# Patient Record
Sex: Female | Born: 1950 | Race: White | Hispanic: No | Smoking: Former smoker
Health system: Southern US, Community
[De-identification: ages and names within clinical notes are randomized; demographics above are authoritative.]

## PROBLEM LIST (undated history)

## (undated) DIAGNOSIS — I739 Peripheral vascular disease, unspecified: Secondary | ICD-10-CM

## (undated) DIAGNOSIS — L570 Actinic keratosis: Secondary | ICD-10-CM

## (undated) HISTORY — DX: Actinic keratosis: L57.0

## (undated) HISTORY — DX: Peripheral vascular disease, unspecified: I73.9

---

## 1998-05-13 ENCOUNTER — Other Ambulatory Visit: Admission: RE | Admit: 1998-05-13 | Discharge: 1998-05-13 | Payer: Self-pay | Admitting: Obstetrics and Gynecology

## 1999-05-31 ENCOUNTER — Other Ambulatory Visit: Admission: RE | Admit: 1999-05-31 | Discharge: 1999-05-31 | Payer: Self-pay | Admitting: Obstetrics and Gynecology

## 1999-05-31 ENCOUNTER — Encounter (INDEPENDENT_AMBULATORY_CARE_PROVIDER_SITE_OTHER): Payer: Self-pay

## 2000-03-04 ENCOUNTER — Encounter: Admission: RE | Admit: 2000-03-04 | Discharge: 2000-03-04 | Payer: Self-pay | Admitting: Obstetrics and Gynecology

## 2000-03-04 ENCOUNTER — Encounter: Payer: Self-pay | Admitting: Obstetrics and Gynecology

## 2000-03-05 ENCOUNTER — Other Ambulatory Visit: Admission: RE | Admit: 2000-03-05 | Discharge: 2000-03-05 | Payer: Self-pay | Admitting: Obstetrics and Gynecology

## 2000-03-05 ENCOUNTER — Encounter (INDEPENDENT_AMBULATORY_CARE_PROVIDER_SITE_OTHER): Payer: Self-pay | Admitting: Specialist

## 2001-04-30 ENCOUNTER — Encounter: Admission: RE | Admit: 2001-04-30 | Discharge: 2001-04-30 | Payer: Self-pay | Admitting: Obstetrics and Gynecology

## 2001-04-30 ENCOUNTER — Encounter: Payer: Self-pay | Admitting: Obstetrics and Gynecology

## 2001-05-02 ENCOUNTER — Encounter: Payer: Self-pay | Admitting: Obstetrics and Gynecology

## 2001-05-02 ENCOUNTER — Encounter: Admission: RE | Admit: 2001-05-02 | Discharge: 2001-05-02 | Payer: Self-pay | Admitting: Obstetrics and Gynecology

## 2002-05-06 ENCOUNTER — Encounter: Payer: Self-pay | Admitting: Obstetrics and Gynecology

## 2002-05-06 ENCOUNTER — Encounter: Admission: RE | Admit: 2002-05-06 | Discharge: 2002-05-06 | Payer: Self-pay | Admitting: Obstetrics and Gynecology

## 2003-08-12 ENCOUNTER — Encounter: Admission: RE | Admit: 2003-08-12 | Discharge: 2003-08-12 | Payer: Self-pay | Admitting: Obstetrics and Gynecology

## 2004-05-29 ENCOUNTER — Ambulatory Visit (HOSPITAL_COMMUNITY): Admission: RE | Admit: 2004-05-29 | Discharge: 2004-05-29 | Payer: Self-pay | Admitting: Obstetrics and Gynecology

## 2004-05-29 IMAGING — US US PELVIS COMPLETE MODIFY
1 series · 14 of 25 positions shown · non-contrast
Comparison: none

HISTORY: Left ovarian mass

[Series 1: unknown · 0.32mm/px · 14 of 54 slices shown]
[im 1/54]
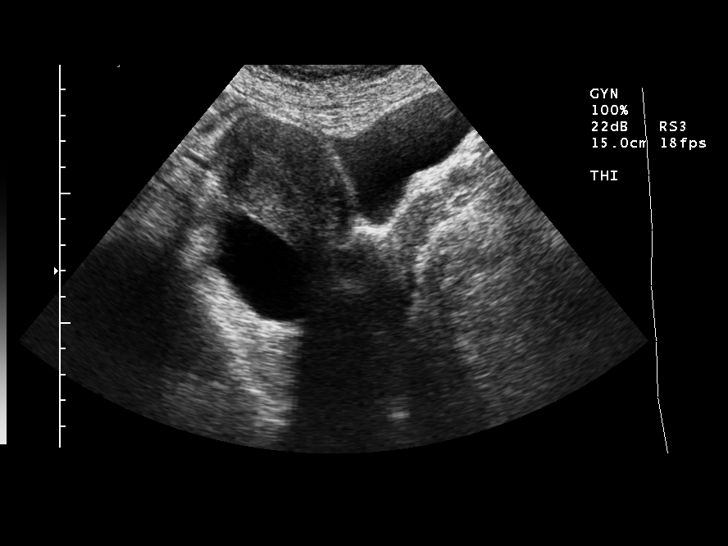
[im 5/54]
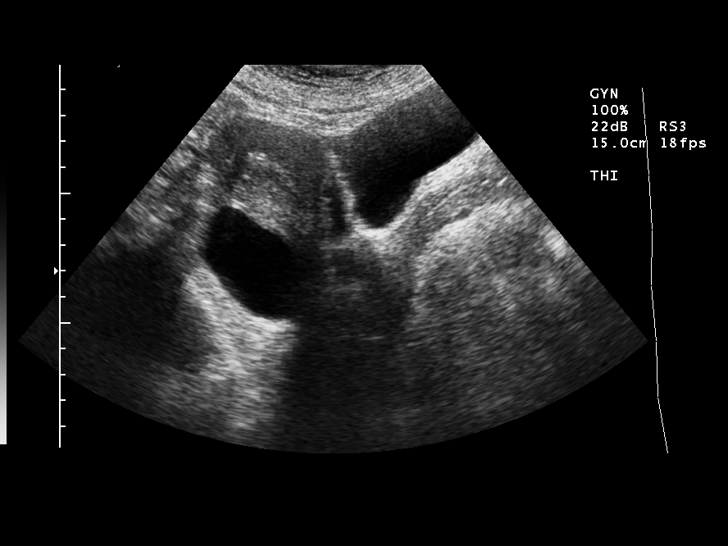
[im 9/54]
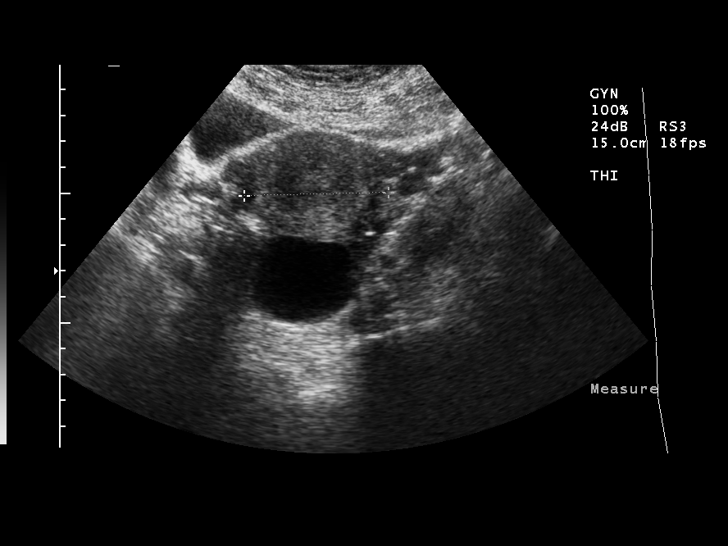
[im 14/54]
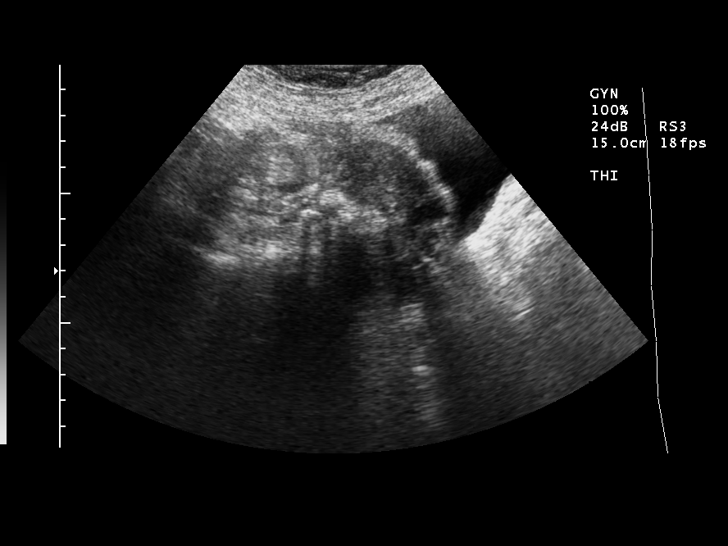
[im 18/54]
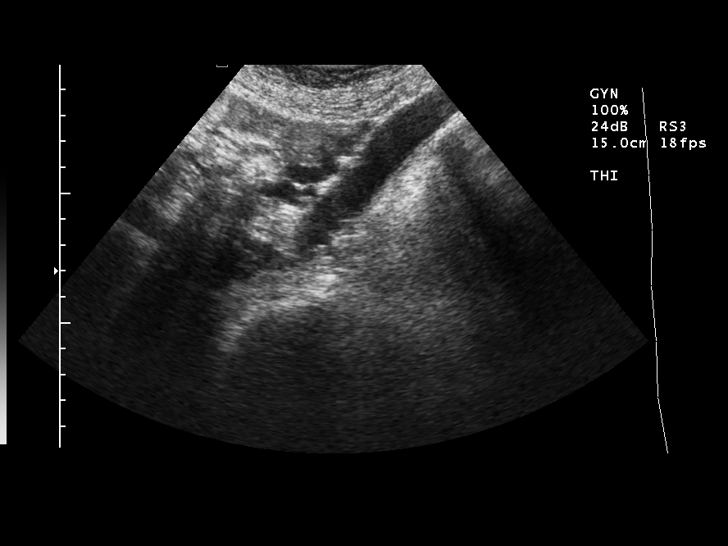
[im 20/54]
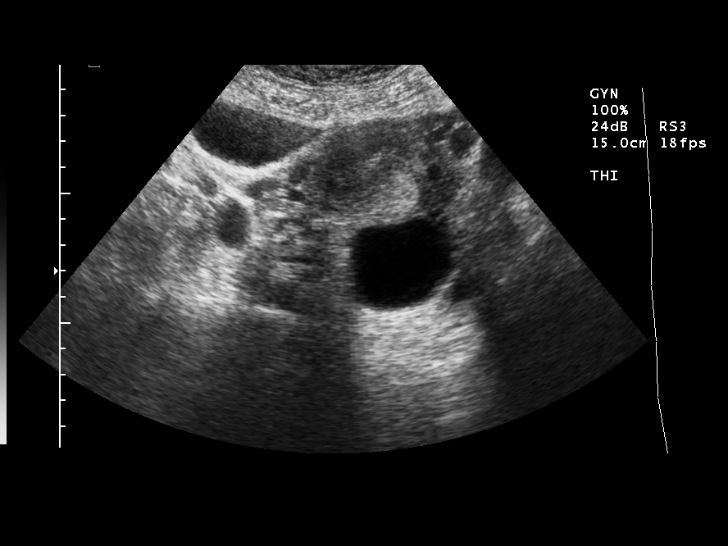
[im 25/54]
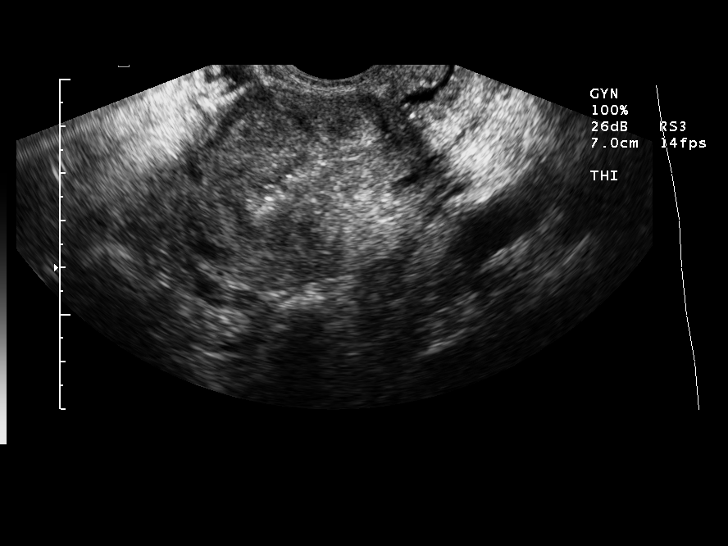
[im 29/54]
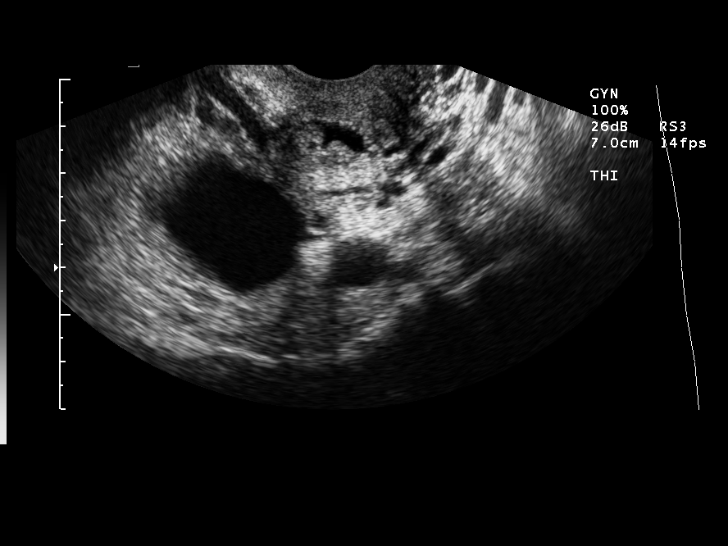
[im 34/54]
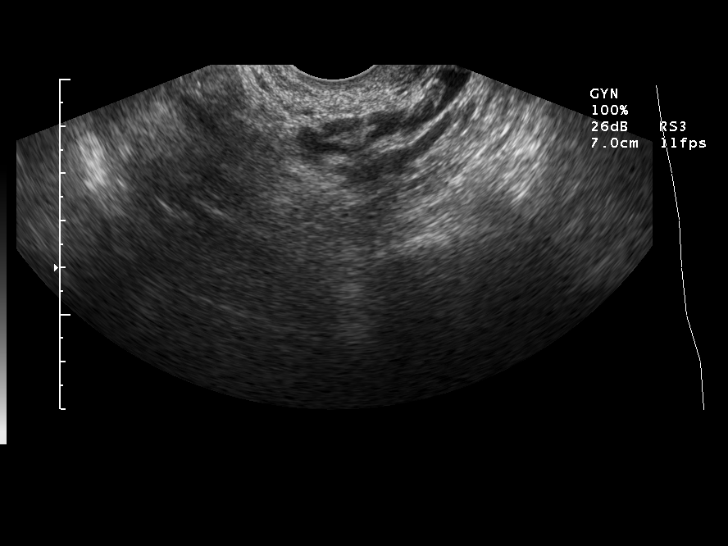
[im 36/54]
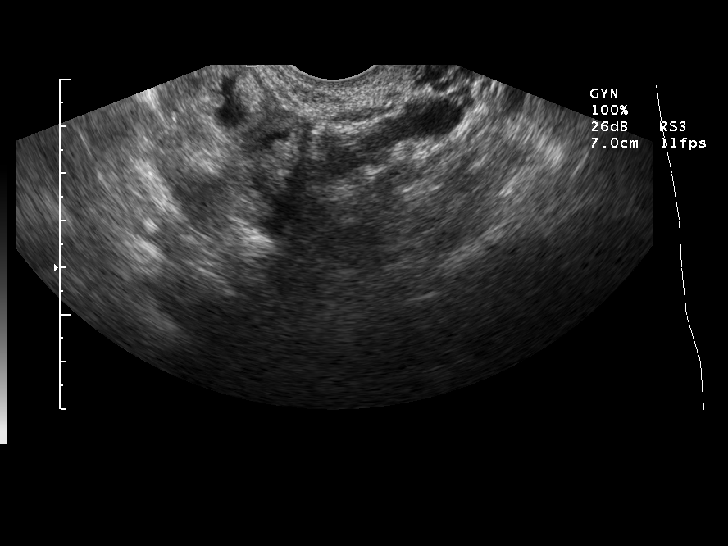
[im 40/54]
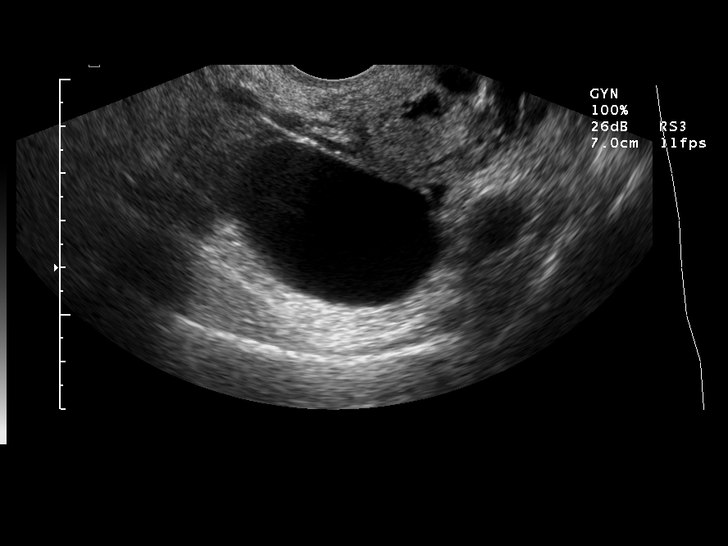
[im 45/54]
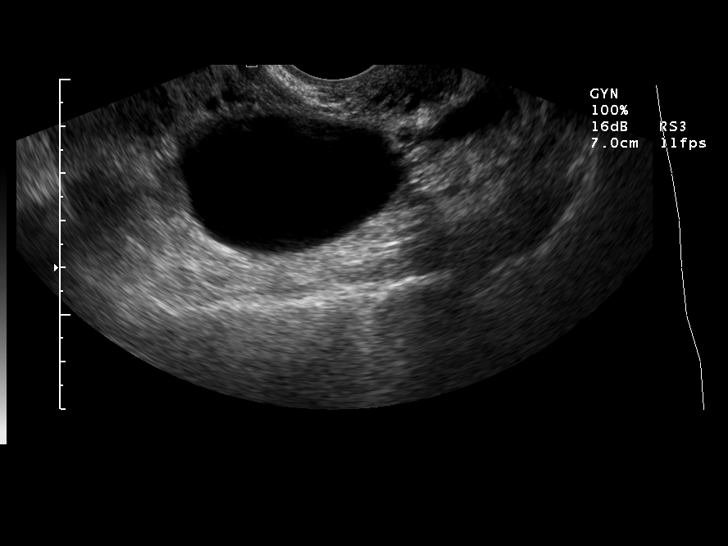
[im 49/54]
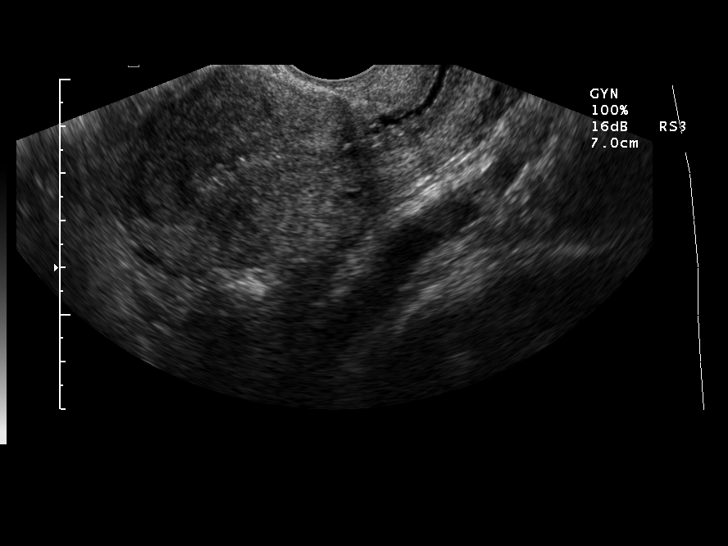
[im 54/54]
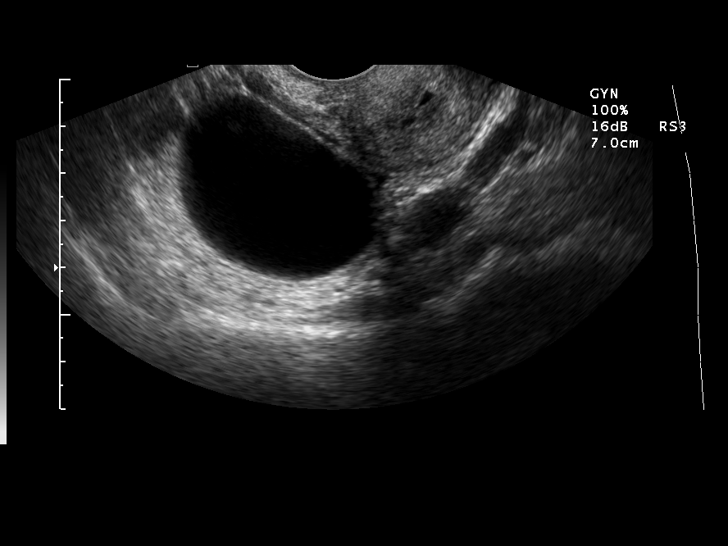

[14 of 25 positions shown; findings below may reference images not displayed]

ULTRASOUND PELVIS COMPLETE MODIFY
ULTRASOUND TRANSVAGINAL PELVIS NON-OBSTETRICAL:

Transabdominal and endovaginal sonography of pelvis performed.

Uterus 10.1 cm length x 4.5 cm AP x 5.6 cm transverse.
Endometrial stripe normal thickness, 9 mm diameter.
Small amount of free pelvic fluid.
No normal ovarian tissue seen bilaterally.
Right adnexal cyst identified, 4.9 x 4.2 x 3.2 cm, question ovarian versus paraovarian.
No solid adnexal mass.
IMPRESSION: Normal appearance of uterus. Nonvisualization of the ovaries. 4.9 cm diameter right adnexal cyst,
question ovarian versus paraovarian.

## 2004-09-22 ENCOUNTER — Encounter: Admission: RE | Admit: 2004-09-22 | Discharge: 2004-09-22 | Payer: Self-pay | Admitting: Obstetrics and Gynecology

## 2005-10-11 ENCOUNTER — Ambulatory Visit (HOSPITAL_COMMUNITY): Admission: RE | Admit: 2005-10-11 | Discharge: 2005-10-11 | Payer: Self-pay | Admitting: Family Medicine

## 2005-10-17 ENCOUNTER — Ambulatory Visit (HOSPITAL_COMMUNITY): Admission: RE | Admit: 2005-10-17 | Discharge: 2005-10-17 | Payer: Self-pay | Admitting: Family Medicine

## 2005-10-18 ENCOUNTER — Encounter: Admission: RE | Admit: 2005-10-18 | Discharge: 2005-10-18 | Payer: Self-pay | Admitting: Obstetrics and Gynecology

## 2005-10-18 IMAGING — MG MM MAMMO SCREENING
3 series · 3 of 3 positions shown · non-contrast
Comparison: none

SCREENING MAMMOGRAM:
There is a fibroglandular pattern.  No masses or malignant type calcifications are identified.  
Compared with prior studies.

[R CC]
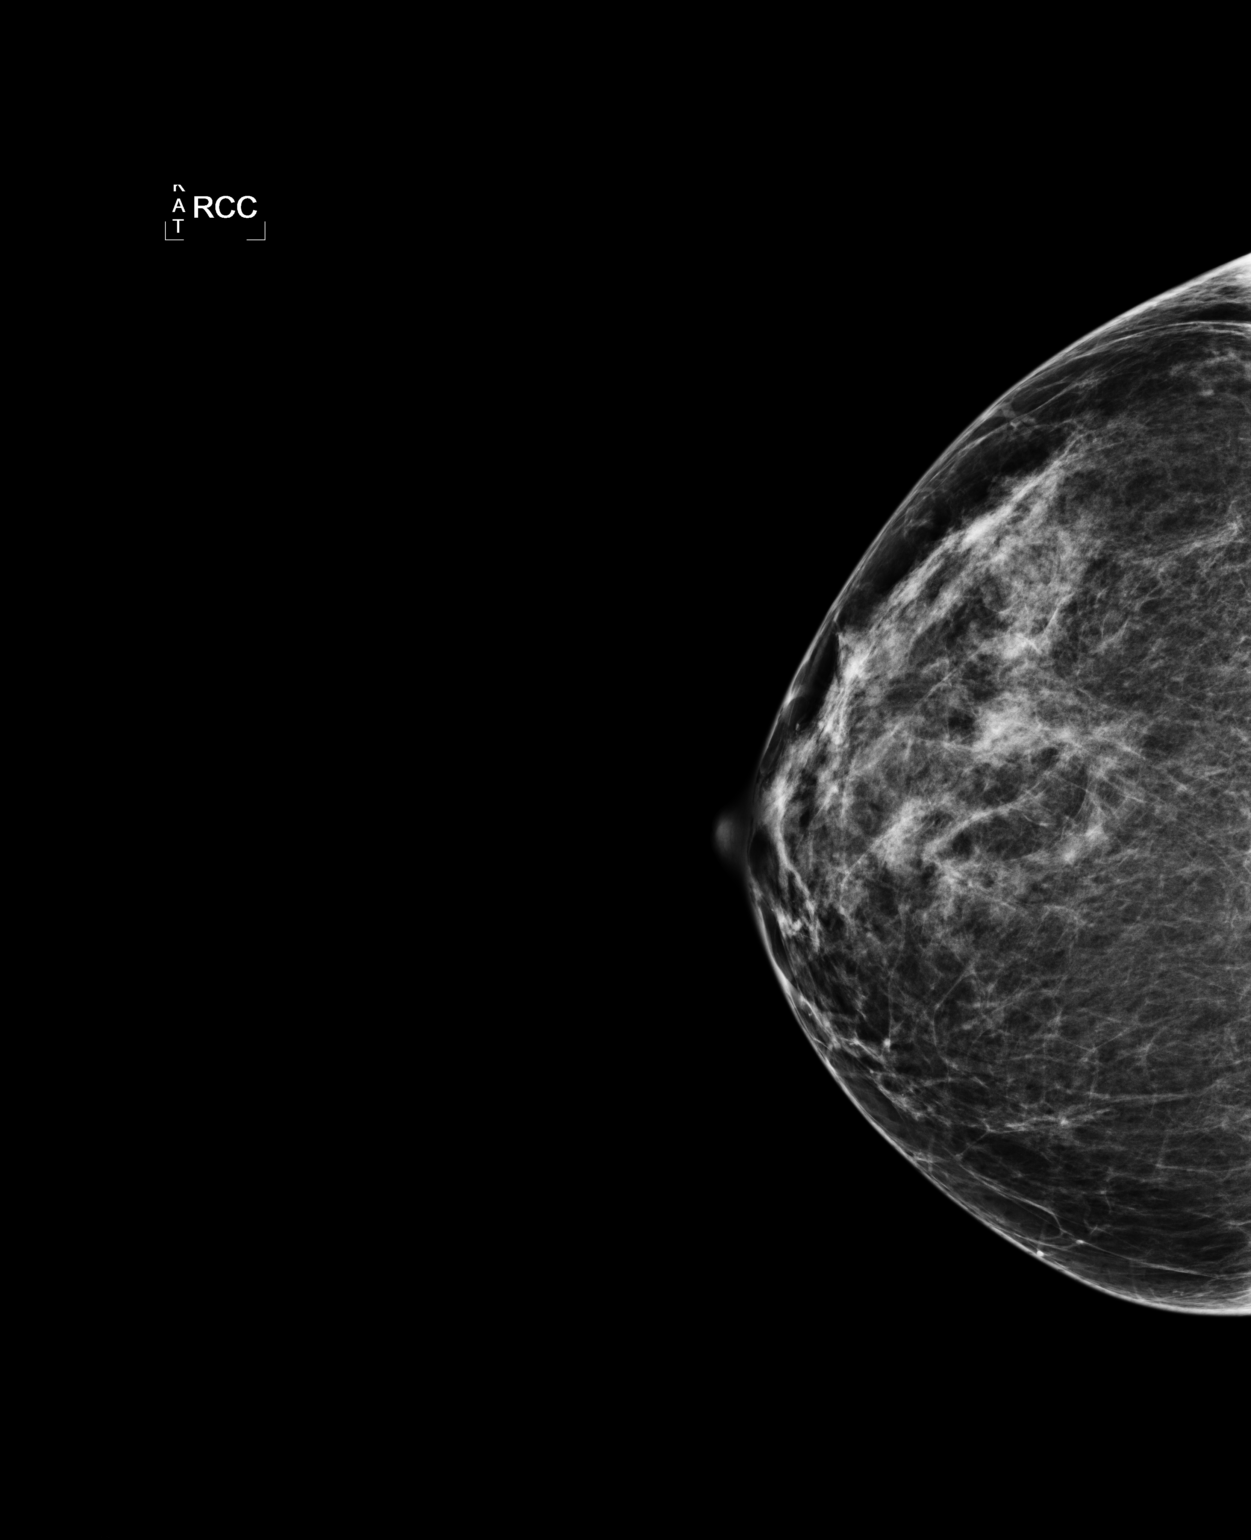

[L CC]
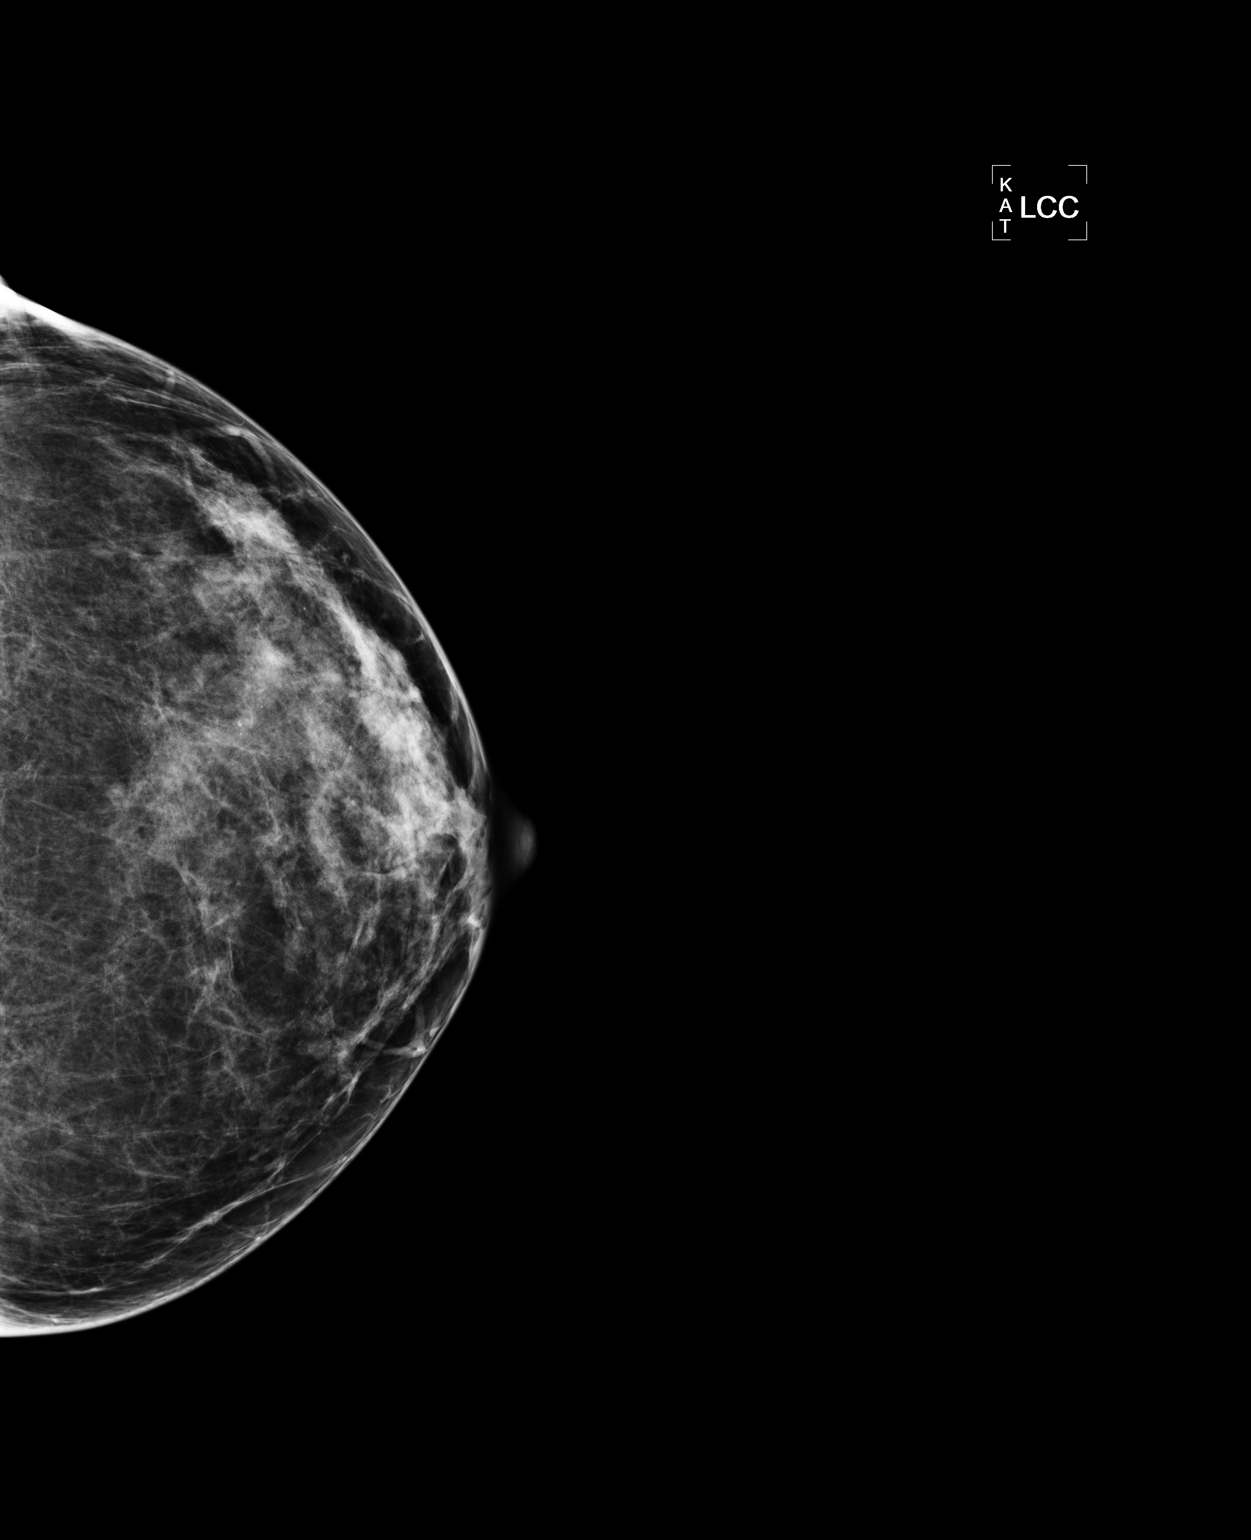

[L MLO]
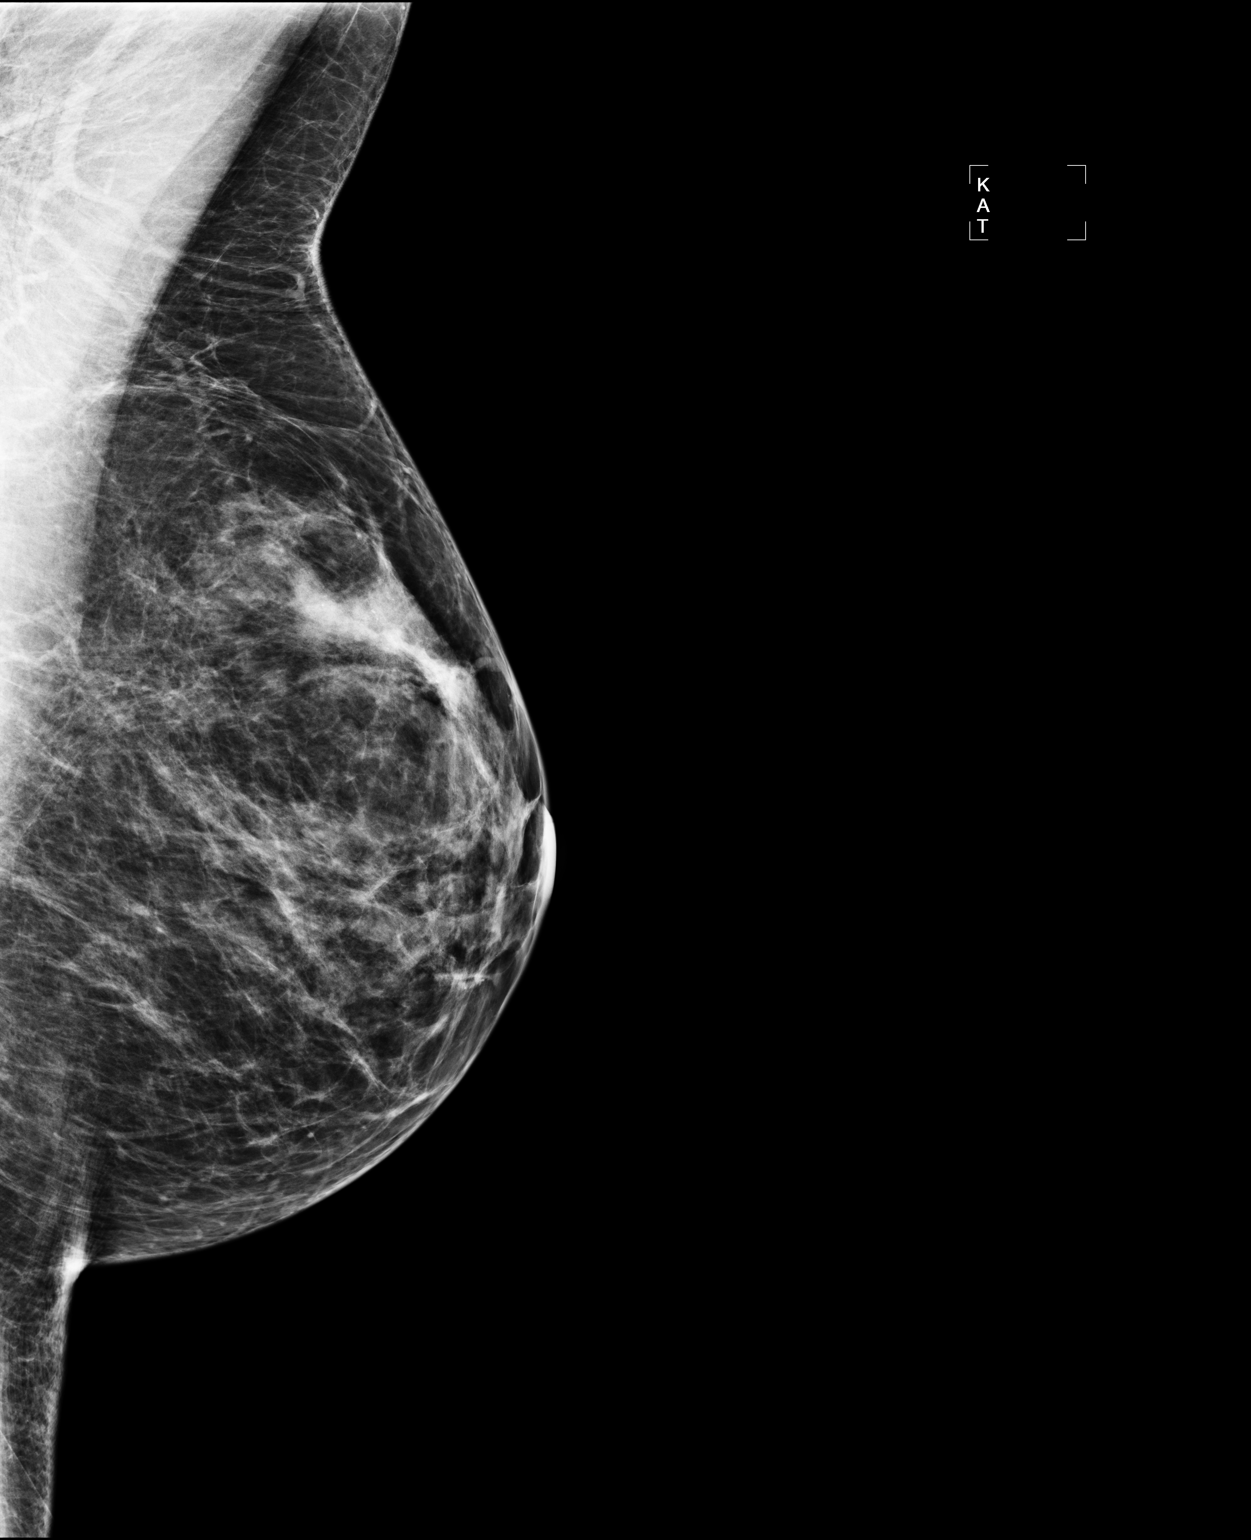

[3 of 3 positions shown; findings below may reference images not displayed]

IMPRESSION: No specific mammographic evidence of malignancy.  Next screening mammogram is recommended in one 
year.

ASSESSMENT: Negative - BI-RADS 1

Screening mammogram in 1 year.

## 2006-06-05 ENCOUNTER — Encounter: Admission: RE | Admit: 2006-06-05 | Discharge: 2006-06-05 | Payer: Self-pay | Admitting: Endocrinology

## 2006-06-21 ENCOUNTER — Encounter: Admission: RE | Admit: 2006-06-21 | Discharge: 2006-06-21 | Payer: Self-pay | Admitting: Endocrinology

## 2006-11-15 ENCOUNTER — Encounter: Admission: RE | Admit: 2006-11-15 | Discharge: 2006-11-15 | Payer: Self-pay | Admitting: Obstetrics and Gynecology

## 2007-12-01 ENCOUNTER — Emergency Department (HOSPITAL_COMMUNITY): Admission: EM | Admit: 2007-12-01 | Discharge: 2007-12-01 | Payer: Self-pay | Admitting: Emergency Medicine

## 2008-03-03 ENCOUNTER — Encounter: Admission: RE | Admit: 2008-03-03 | Discharge: 2008-03-03 | Payer: Self-pay | Admitting: Obstetrics and Gynecology

## 2008-08-06 HISTORY — PX: BREAST EXCISIONAL BIOPSY: SUR124

## 2009-03-11 ENCOUNTER — Encounter: Admission: RE | Admit: 2009-03-11 | Discharge: 2009-03-11 | Payer: Self-pay | Admitting: Obstetrics and Gynecology

## 2009-03-16 ENCOUNTER — Encounter: Admission: RE | Admit: 2009-03-16 | Discharge: 2009-03-16 | Payer: Self-pay | Admitting: Obstetrics and Gynecology

## 2009-05-04 ENCOUNTER — Encounter: Admission: RE | Admit: 2009-05-04 | Discharge: 2009-05-04 | Payer: Self-pay | Admitting: General Surgery

## 2009-05-04 IMAGING — CR DG CHEST 2V
2 series · 2 of 2 positions shown · non-contrast
Comparison: Chest x-ray of [DATE]

CLINICAL DATA: Preop for surgery for left breast lesion, smoking
history

CHEST - 2 VIEW

[w chest pa]
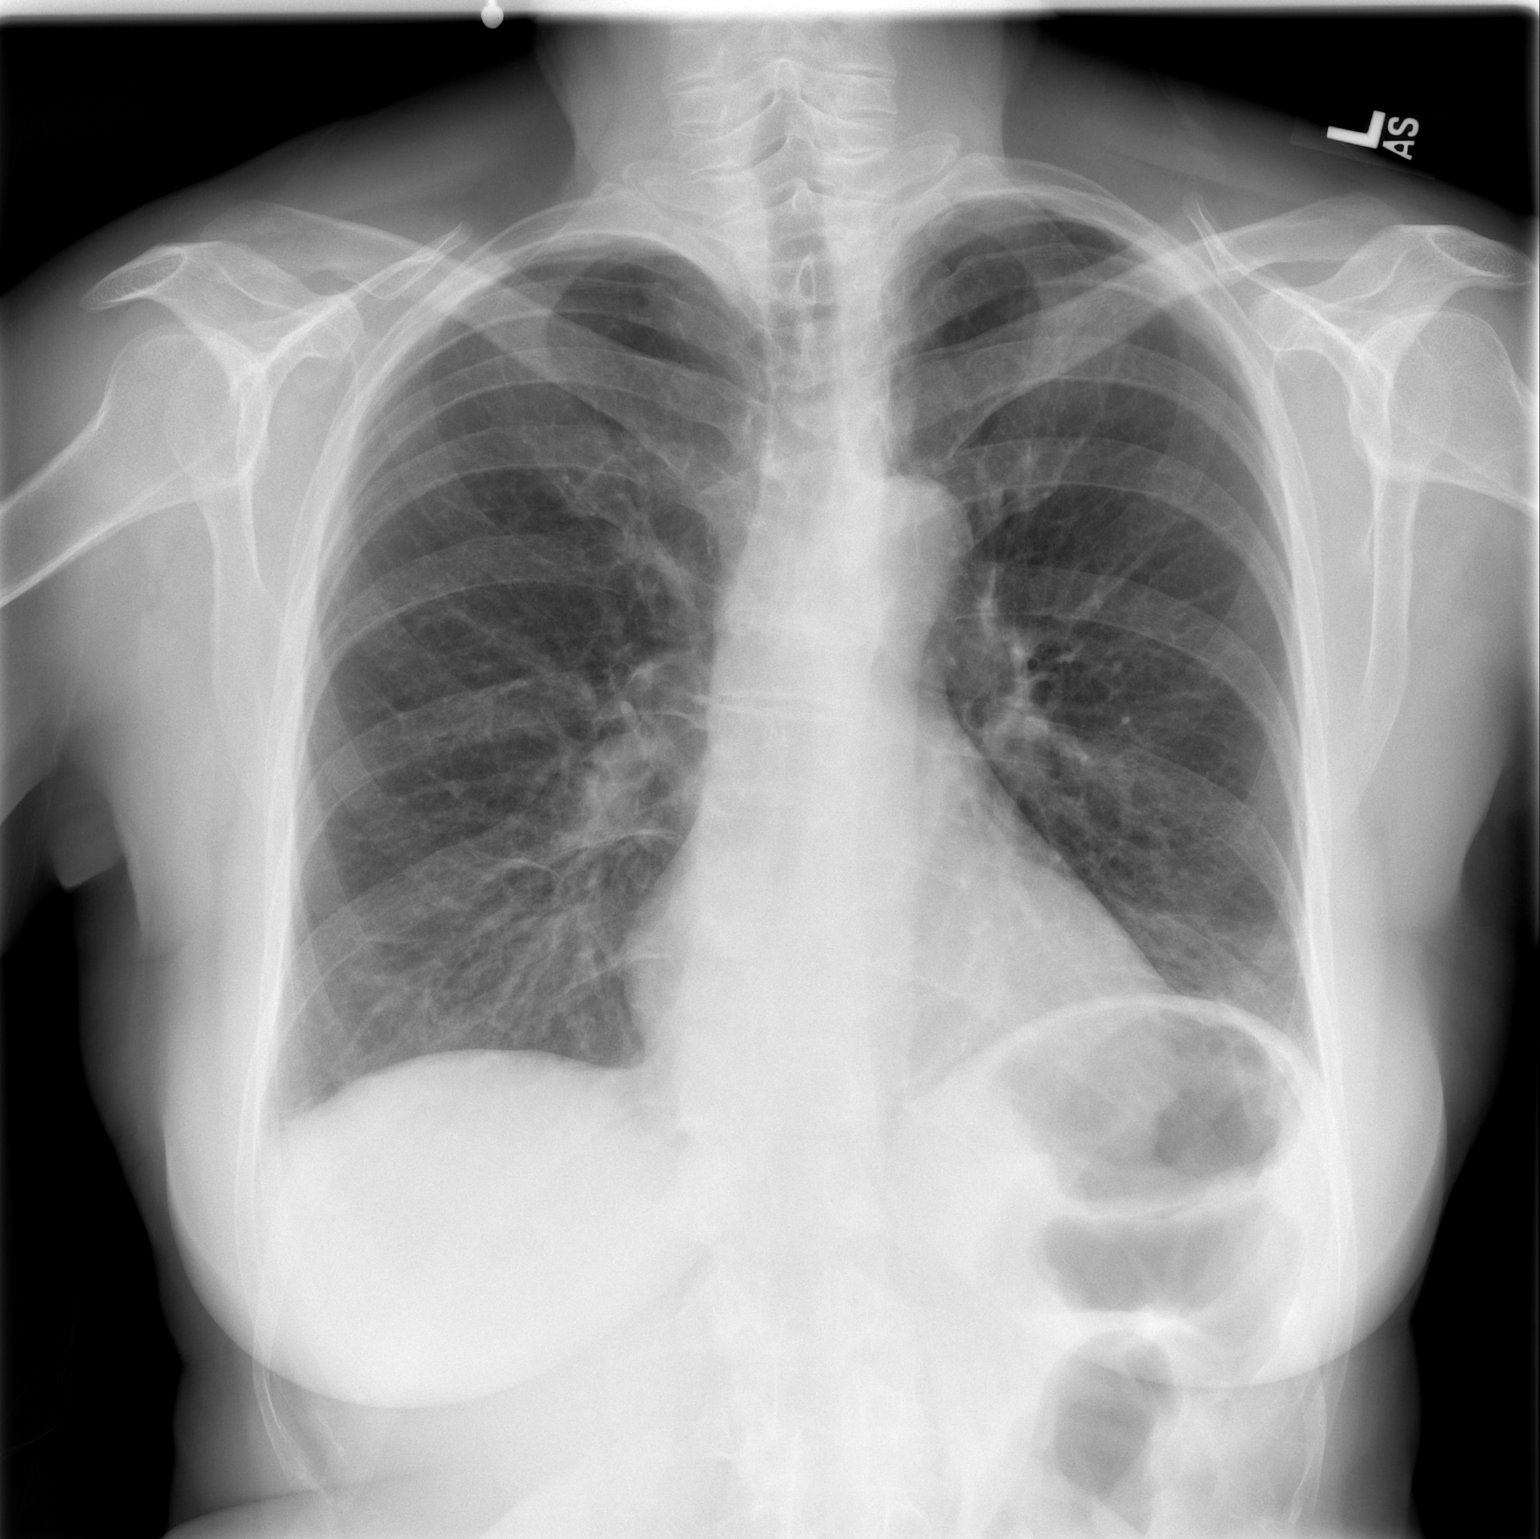

[w chest lat]
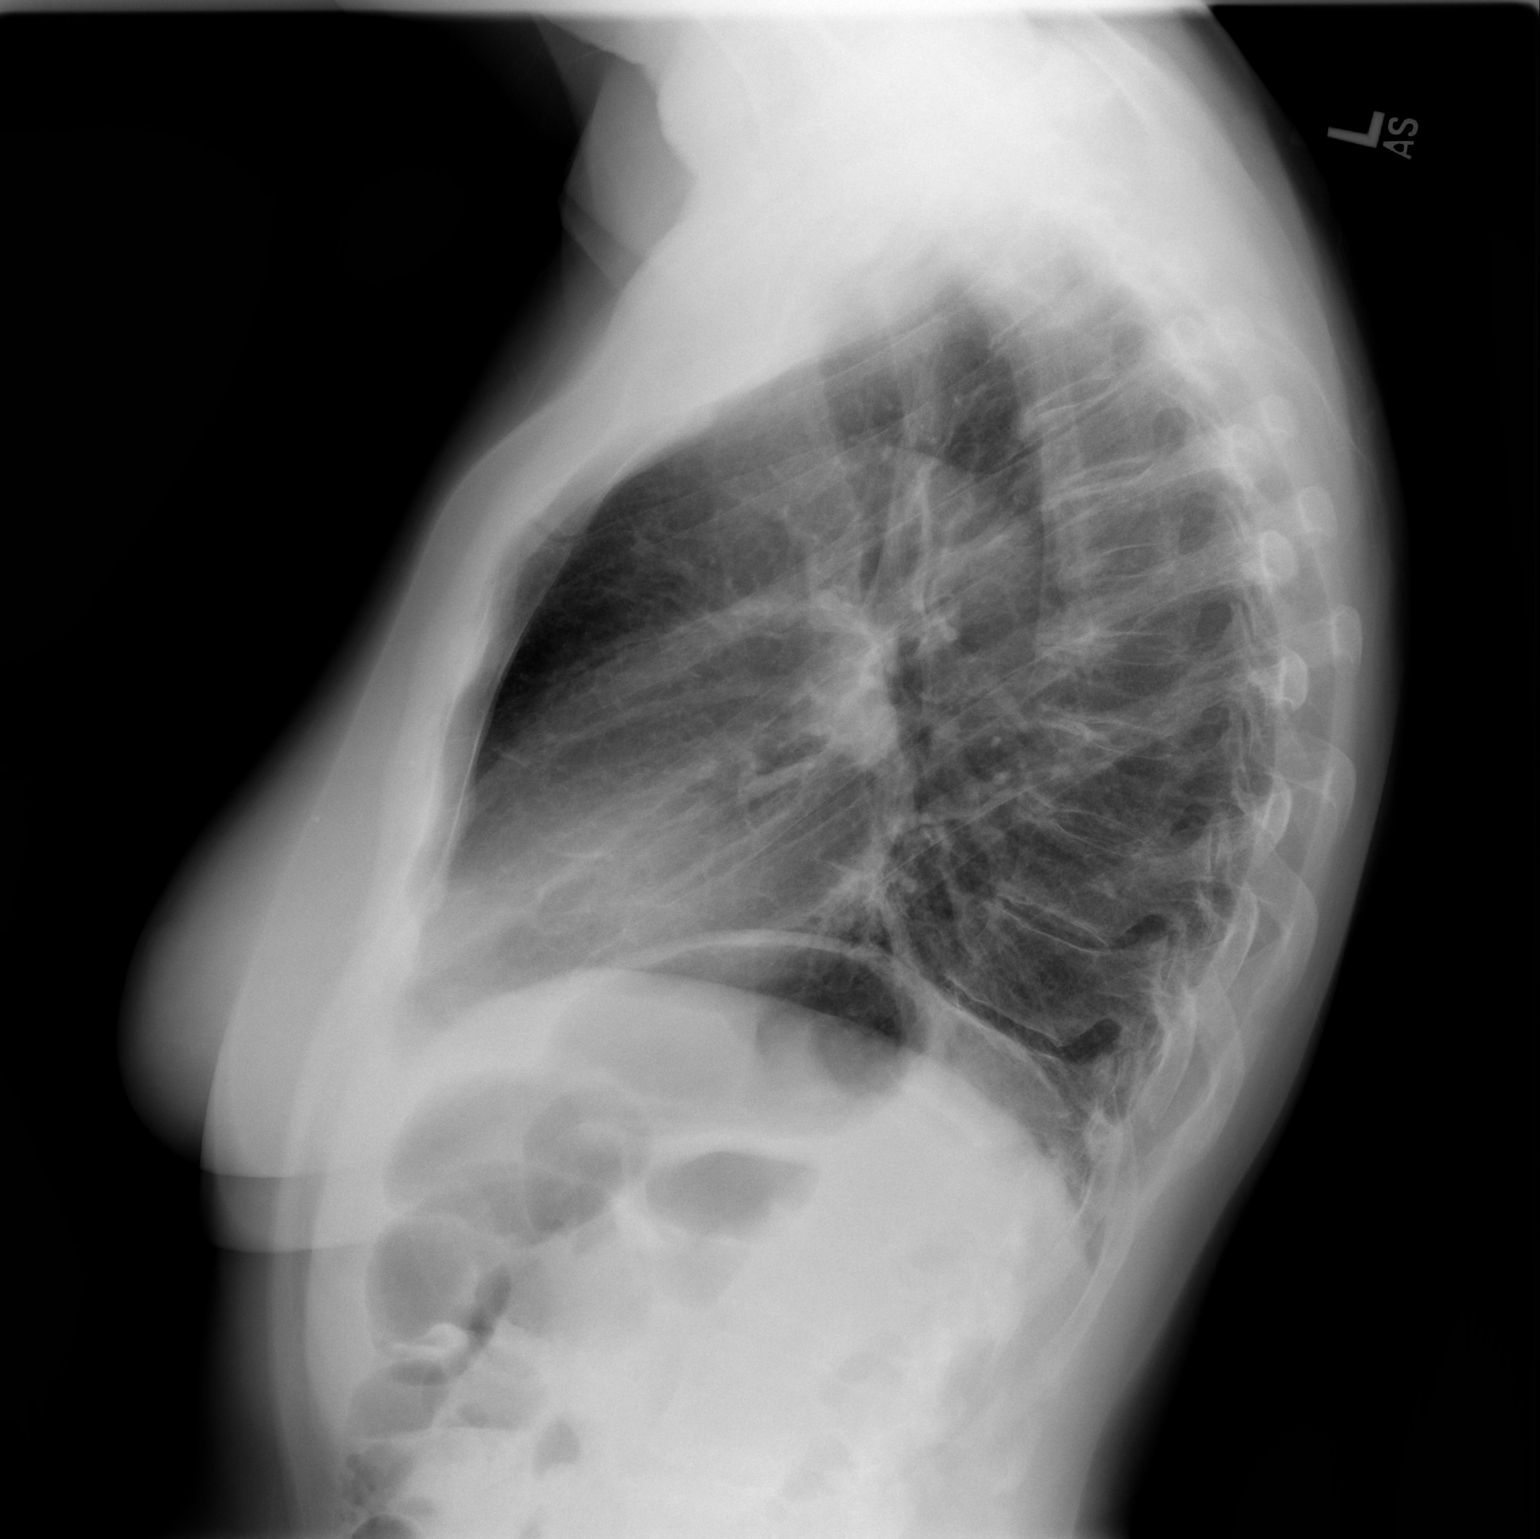

[2 of 2 positions shown; findings below may reference images not displayed]

FINDINGS: No active infiltrate or effusion is seen.  Minimal
atelectasis or scarring is noted at the left lung base.  There is
some peribronchial thickening which may indicate bronchitis.  The
heart is within normal limits in size.  No bony abnormality is
seen.
IMPRESSION: No active lung disease.  Mild left basilar linear atelectasis or
scarring.

## 2009-05-09 ENCOUNTER — Ambulatory Visit (HOSPITAL_BASED_OUTPATIENT_CLINIC_OR_DEPARTMENT_OTHER): Admission: RE | Admit: 2009-05-09 | Discharge: 2009-05-09 | Payer: Self-pay | Admitting: General Surgery

## 2009-05-09 ENCOUNTER — Encounter: Admission: RE | Admit: 2009-05-09 | Discharge: 2009-05-09 | Payer: Self-pay | Admitting: General Surgery

## 2009-05-09 ENCOUNTER — Encounter (INDEPENDENT_AMBULATORY_CARE_PROVIDER_SITE_OTHER): Payer: Self-pay | Admitting: General Surgery

## 2009-09-16 ENCOUNTER — Ambulatory Visit (HOSPITAL_COMMUNITY): Admission: RE | Admit: 2009-09-16 | Discharge: 2009-09-16 | Payer: Self-pay | Admitting: Obstetrics and Gynecology

## 2009-09-16 IMAGING — US US TRANSVAGINAL NON-OB
1 series · 13 of 25 positions shown · non-contrast
Comparison: [DATE]

CLINICAL DATA: Left ovarian mass, history of ovarian cyst and
cesarean section

TRANSABDOMINAL AND TRANSVAGINAL ULTRASOUND OF PELVIS
TECHNIQUE: Both transabdominal and transvaginal ultrasound
examinations of the pelvis were performed including evaluation of
the uterus, ovaries, adnexal regions, and pelvic cul-de-sac.

[Series 1: us transvaginal non-ob · 0.30mm/px · 13 of 54 slices shown]
[im 1/54]
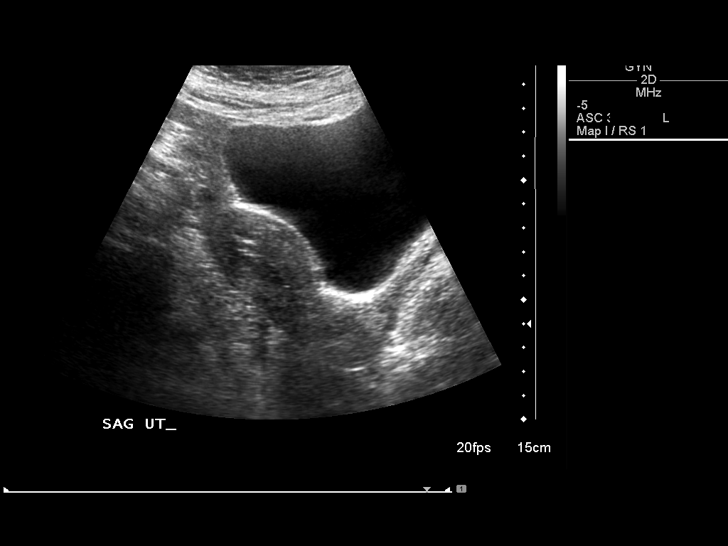
[im 5/54]
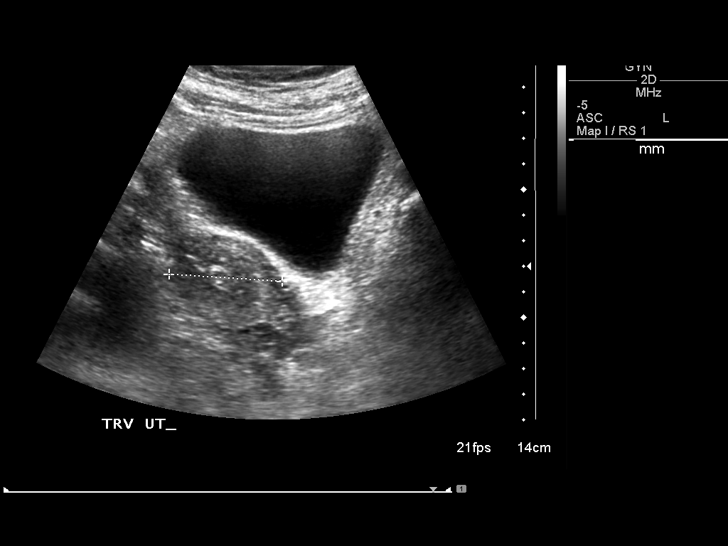
[im 9/54]
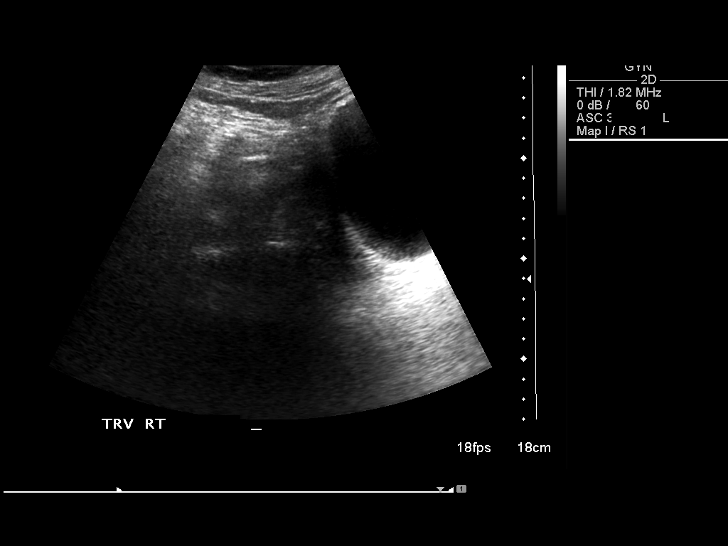
[im 14/54]
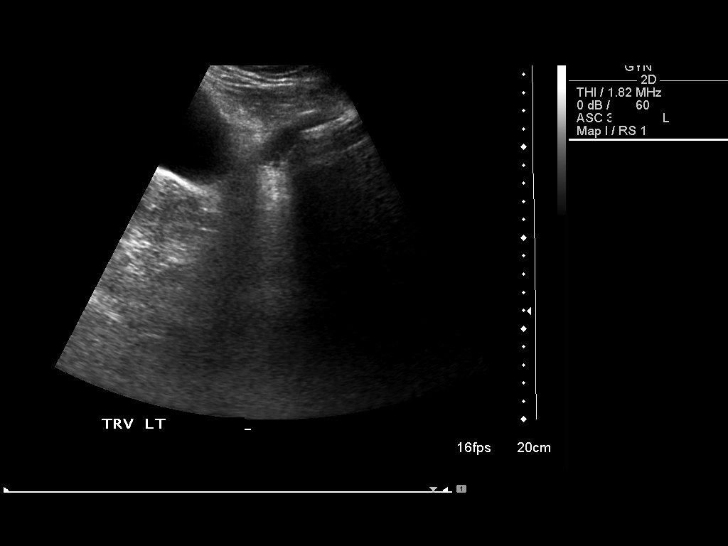
[im 18/54]
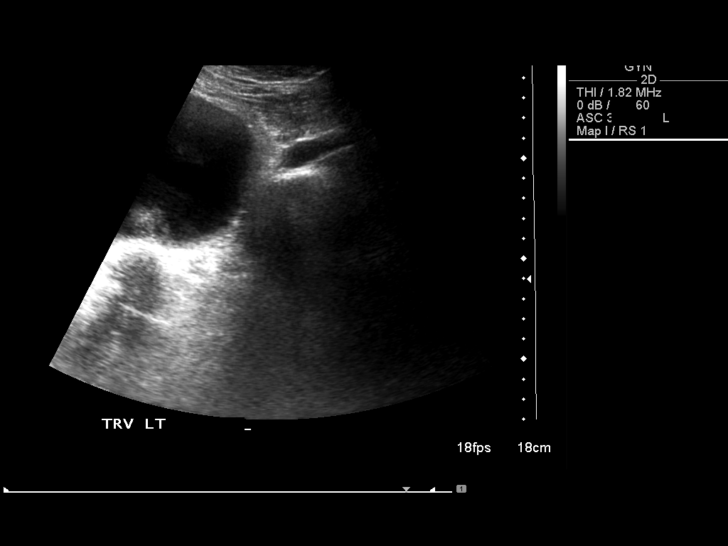
[im 23/54]
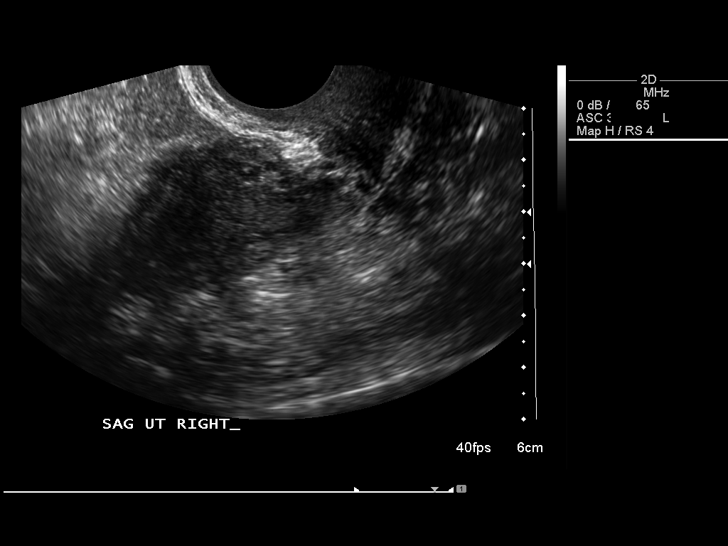
[im 27/54]
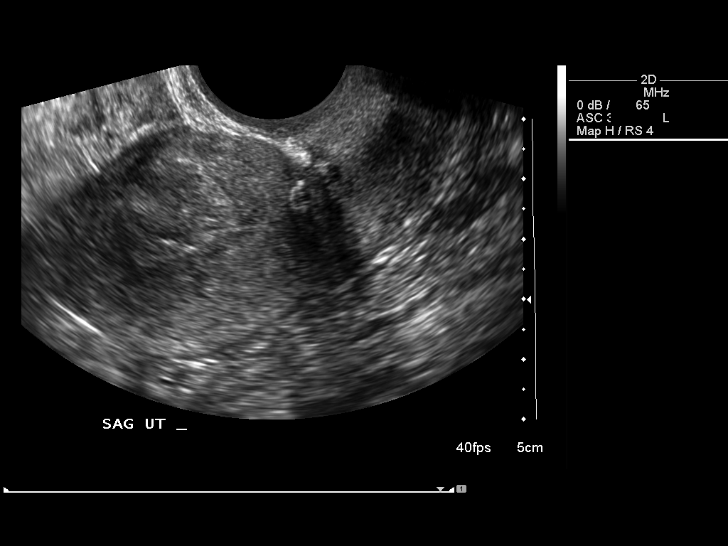
[im 31/54]
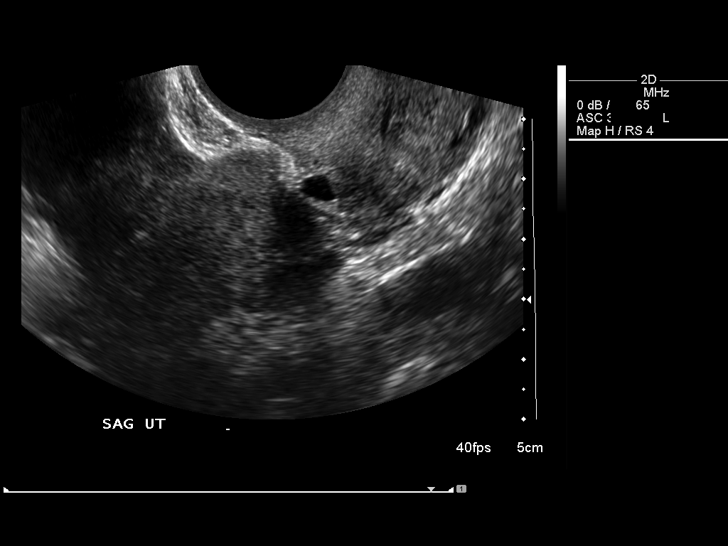
[im 36/54]
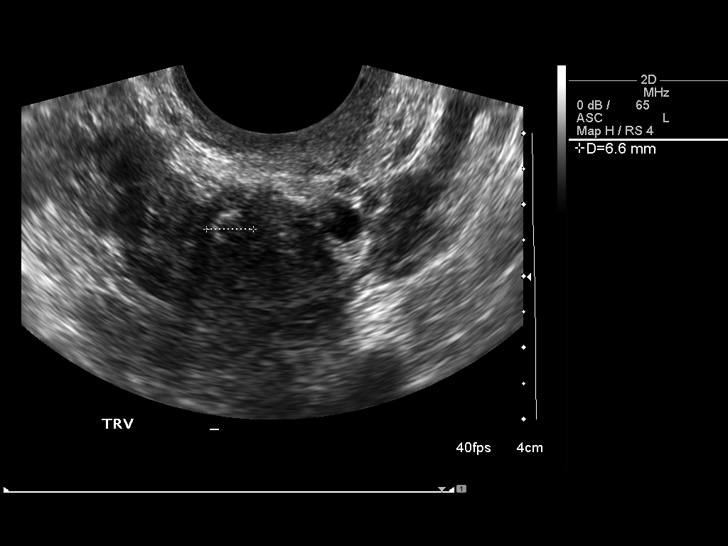
[im 40/54]
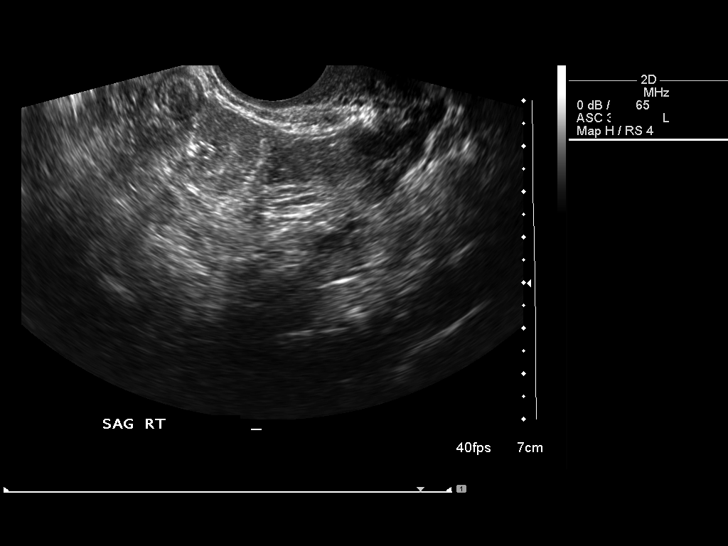
[im 45/54]
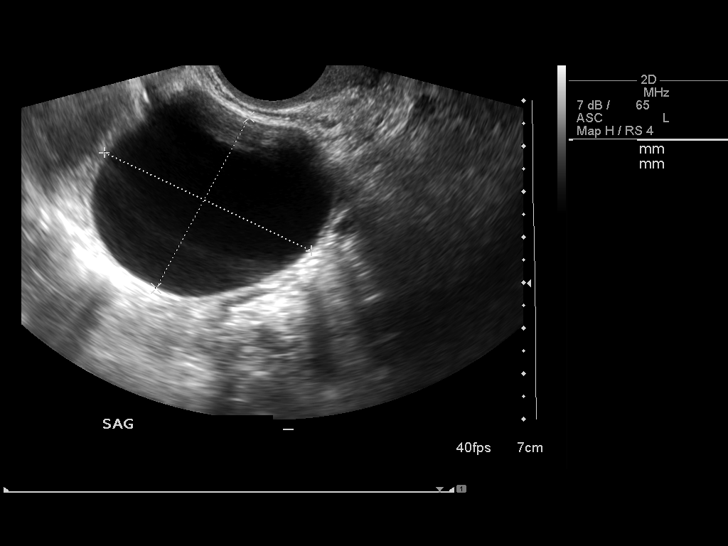
[im 49/54]
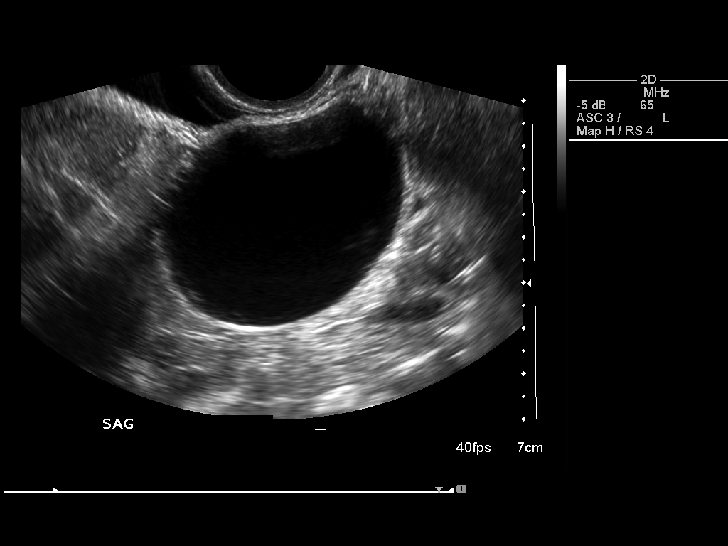
[im 54/54]
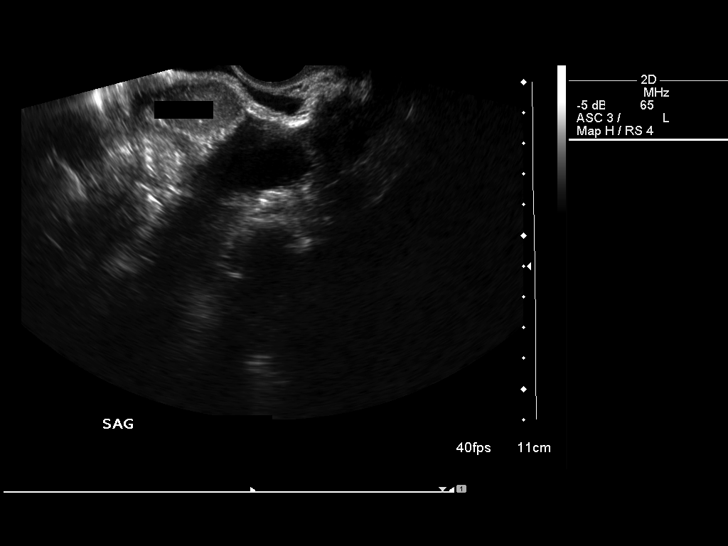

[13 of 25 positions shown; findings below may reference images not displayed]

FINDINGS: Uterus measures 8.8 cm length by 3.4 cm AP by 4.4 cm transverse.
Focus of abnormal echogenicity at anterior aspect of the mid to
lower uterus, suspect related to scarring from cesarean section.
No definite focal uterine mass.

Endometrium measures 5 mm thick, normal.  Minimal fluid in the
cervical canal.

Right Ovary not visualized on either transabdominal or endovaginal
imaging due to presence of significant bowel in the right adnexa.

Left Ovary is not definitely visualized.  No discrete normal
appearing ovarian tissue is seen.  In left adnexa, a cystic
structure is identified 5.0 x 4.3 x 5.5 cm in size, simple in
character, with a thin wall. No definite mural nodules or
septations.

Other Findings:  No free pelvic fluid or additional adnexal masses.
IMPRESSION: Probable scarring anterior aspect of the mid to lower uterus,
likely related to the prior cesarean section.
Nonvisualization of right ovary due to bowel gas.
Nonvisualization of left ovary, with a large simple appearing cyst
identified in left adnexa, 5.5 cm greatest size, question ovarian
versus paraovarian in origin.
Recommend follow-up sonographic surveillance imaging in 1 year.
This recommendation follows the consensus statement:  "Management
of Asymptomatic Ovarian and Other Adnexal Cysts Imaged at US:
Society of Radiologists in Ultrasound Consensus Conference
[URL]

## 2010-04-04 ENCOUNTER — Encounter: Admission: RE | Admit: 2010-04-04 | Discharge: 2010-04-04 | Payer: Self-pay | Admitting: Obstetrics and Gynecology

## 2010-11-10 LAB — URINALYSIS, ROUTINE W REFLEX MICROSCOPIC
Ketones, ur: NEGATIVE mg/dL
Nitrite: NEGATIVE
Protein, ur: NEGATIVE mg/dL
Urobilinogen, UA: 0.2 mg/dL (ref 0.0–1.0)

## 2010-11-10 LAB — DIFFERENTIAL
Basophils Relative: 1 % (ref 0–1)
Monocytes Absolute: 0.5 10*3/uL (ref 0.1–1.0)
Monocytes Relative: 8 % (ref 3–12)
Neutro Abs: 2.8 10*3/uL (ref 1.7–7.7)

## 2010-11-10 LAB — COMPREHENSIVE METABOLIC PANEL
ALT: 10 U/L (ref 0–35)
Albumin: 3.7 g/dL (ref 3.5–5.2)
Alkaline Phosphatase: 60 U/L (ref 39–117)
BUN: 11 mg/dL (ref 6–23)
Potassium: 3.9 mEq/L (ref 3.5–5.1)
Sodium: 139 mEq/L (ref 135–145)
Total Protein: 6.8 g/dL (ref 6.0–8.3)

## 2010-11-10 LAB — CBC
Platelets: 269 10*3/uL (ref 150–400)
RDW: 13.1 % (ref 11.5–15.5)

## 2011-04-30 ENCOUNTER — Other Ambulatory Visit: Payer: Self-pay | Admitting: Nurse Practitioner

## 2011-04-30 DIAGNOSIS — Z1231 Encounter for screening mammogram for malignant neoplasm of breast: Secondary | ICD-10-CM

## 2011-05-08 ENCOUNTER — Other Ambulatory Visit: Payer: Self-pay | Admitting: Family Medicine

## 2011-05-08 DIAGNOSIS — E041 Nontoxic single thyroid nodule: Secondary | ICD-10-CM

## 2011-05-09 ENCOUNTER — Ambulatory Visit
Admission: RE | Admit: 2011-05-09 | Discharge: 2011-05-09 | Disposition: A | Discharge status: Home / Self Care | Payer: BC Managed Care – PPO | Source: Ambulatory Visit | Attending: Nurse Practitioner | Admitting: Nurse Practitioner

## 2011-05-09 DIAGNOSIS — Z1231 Encounter for screening mammogram for malignant neoplasm of breast: Secondary | ICD-10-CM

## 2011-05-14 ENCOUNTER — Other Ambulatory Visit: Payer: Self-pay

## 2012-04-23 ENCOUNTER — Other Ambulatory Visit: Payer: Self-pay | Admitting: Nurse Practitioner

## 2012-04-23 DIAGNOSIS — Z1231 Encounter for screening mammogram for malignant neoplasm of breast: Secondary | ICD-10-CM

## 2012-05-21 ENCOUNTER — Ambulatory Visit
Admission: RE | Admit: 2012-05-21 | Discharge: 2012-05-21 | Disposition: A | Discharge status: Home / Self Care | Payer: BC Managed Care – PPO | Source: Ambulatory Visit | Attending: Nurse Practitioner | Admitting: Nurse Practitioner

## 2012-05-21 DIAGNOSIS — Z1231 Encounter for screening mammogram for malignant neoplasm of breast: Secondary | ICD-10-CM

## 2013-07-08 ENCOUNTER — Other Ambulatory Visit: Payer: Self-pay

## 2013-07-08 ENCOUNTER — Other Ambulatory Visit: Payer: Self-pay | Admitting: Internal Medicine

## 2013-07-08 DIAGNOSIS — Z1231 Encounter for screening mammogram for malignant neoplasm of breast: Secondary | ICD-10-CM

## 2013-08-11 ENCOUNTER — Ambulatory Visit
Admission: RE | Admit: 2013-08-11 | Discharge: 2013-08-11 | Disposition: A | Discharge status: Home / Self Care | Payer: BC Managed Care – PPO | Source: Ambulatory Visit

## 2013-08-11 DIAGNOSIS — Z1231 Encounter for screening mammogram for malignant neoplasm of breast: Secondary | ICD-10-CM

## 2014-06-28 ENCOUNTER — Ambulatory Visit: Payer: Self-pay | Admitting: Otolaryngology

## 2014-06-28 IMAGING — CR DG CHEST 2V
1 series · 2 of 2 positions shown · non-contrast
Comparison: PA and lateral chest of [DATE]

CLINICAL DATA: Chronic cough ; gastroesophageal reflux

EXAM:
CHEST  2 VIEW

[Series 1: w chest pa · 0.14mm/px · 2 of 2 slices shown]
[im 1/2]
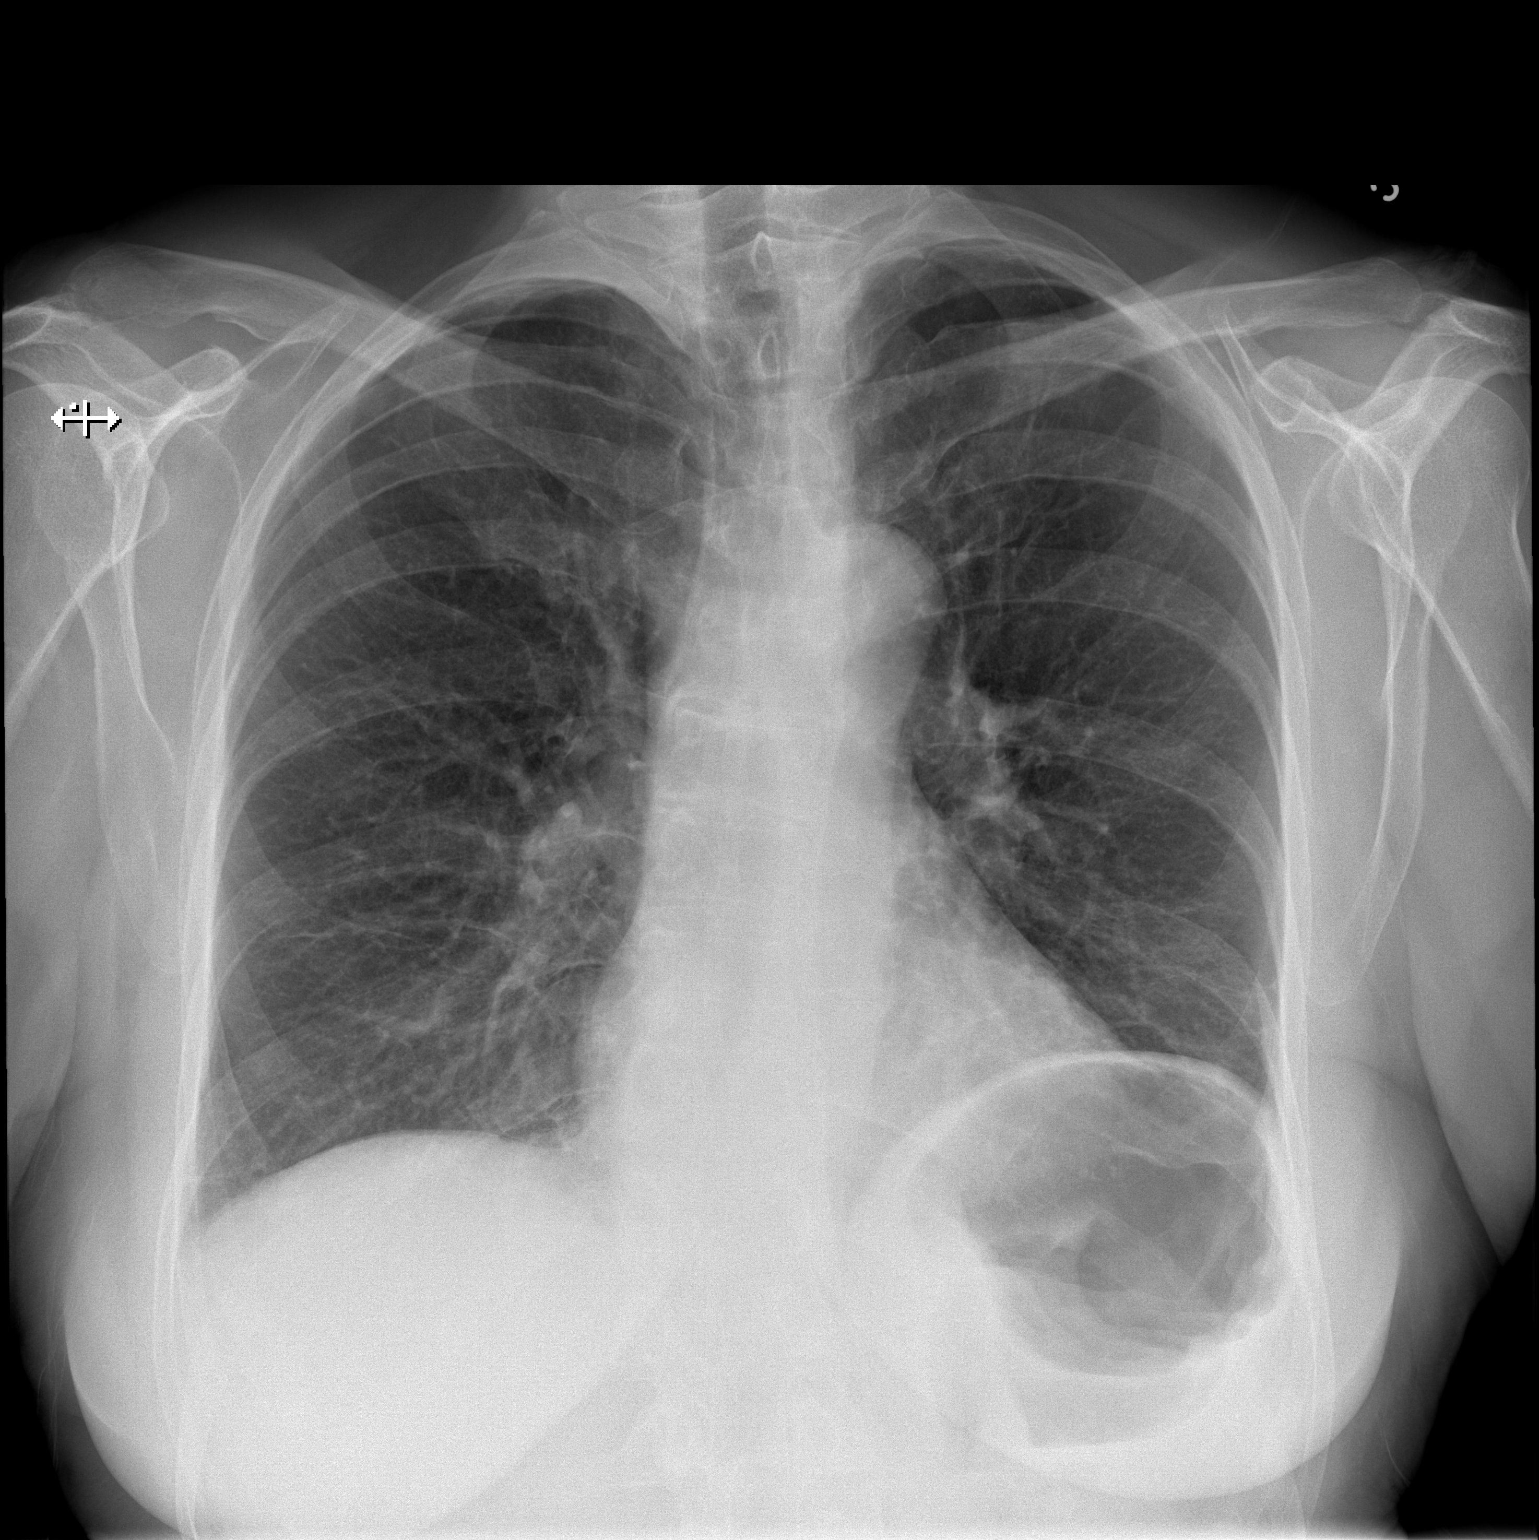
[im 2/2]
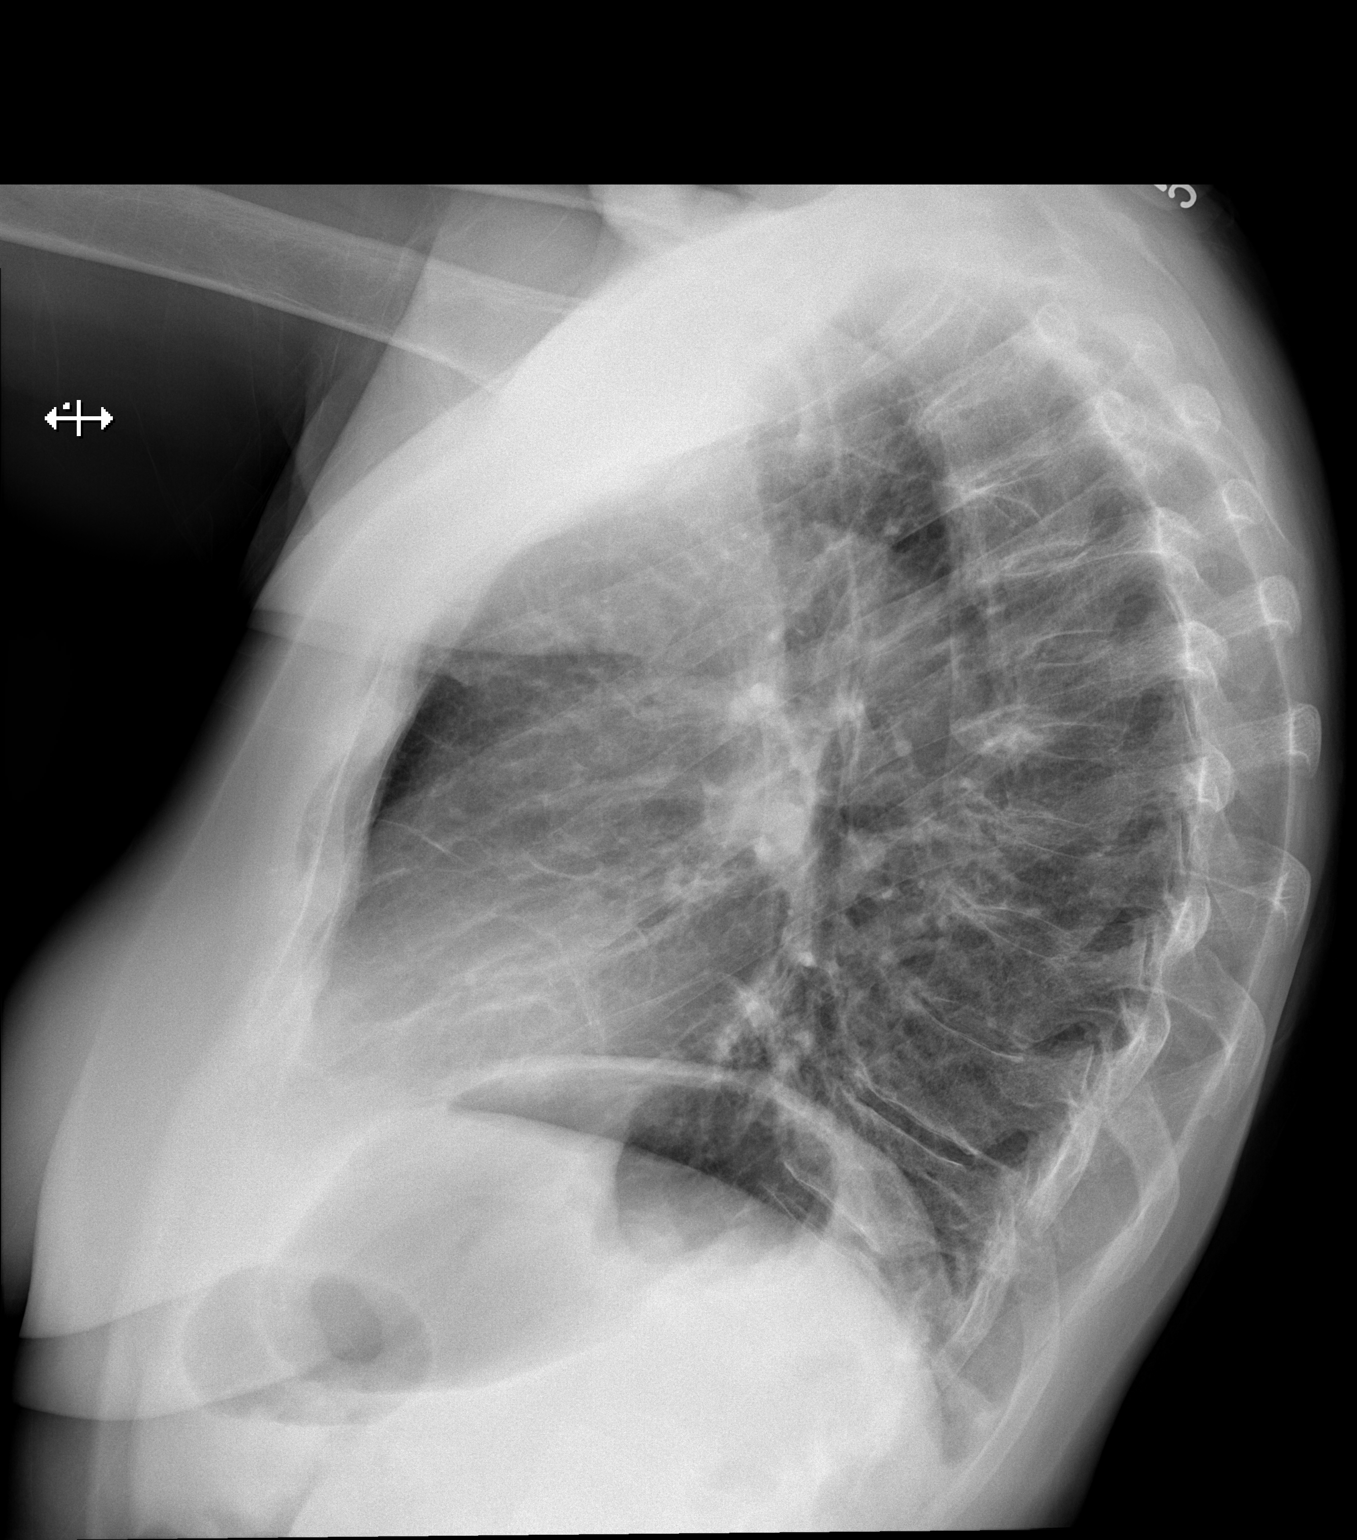

[2 of 2 positions shown; findings below may reference images not displayed]

FINDINGS: The lungs are adequately inflated. The interstitial markings are
slightly increased in the lower lung zones. The heart and pulmonary
vascularity are within the limits of normal. There is no pleural
effusion. The observed bony thorax exhibits no acute abnormality.
IMPRESSION: Mildly increased interstitial markings may reflect bronchitic
change. There is no evidence of pneumonia, CHF, nor other acute
cardiopulmonary abnormality.

## 2014-07-05 ENCOUNTER — Ambulatory Visit: Payer: Self-pay | Admitting: Otolaryngology

## 2014-07-05 IMAGING — US THYROID ULTRASOUND
1 series · 14 of 25 positions shown · non-contrast
Comparison: Scintigraphy [DATE]

CLINICAL DATA: Thyroid Nodules, palpable. History of
hypothyroidism.

EXAM:
THYROID ULTRASOUND
TECHNIQUE: Ultrasound examination of the thyroid gland and adjacent soft
tissues was performed.

[Series 1: thyroid ultrasound · 0.08mm/px · 14 of 73 slices shown]
[im 1/73]
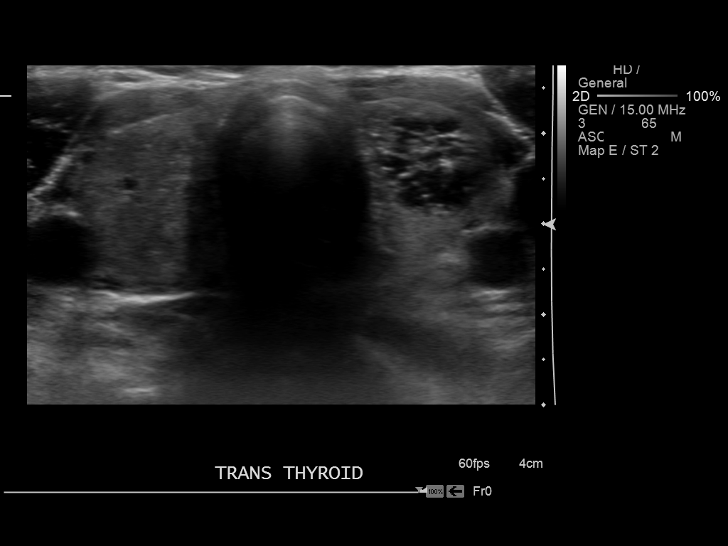
[im 7/73]
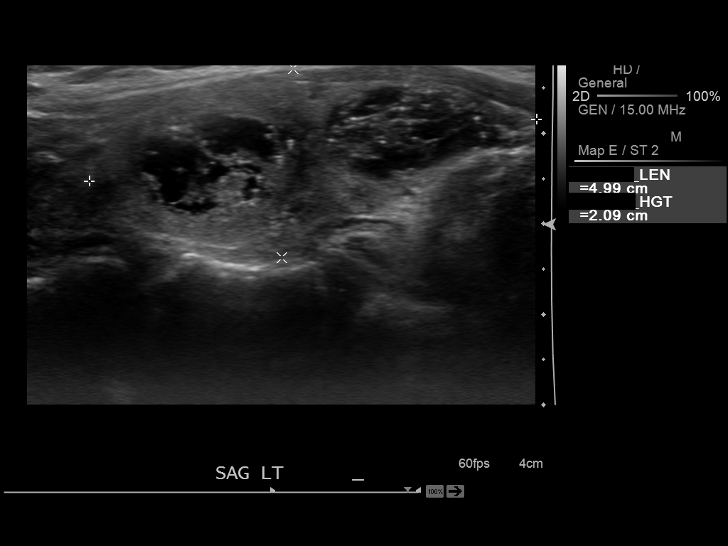
[im 13/73]
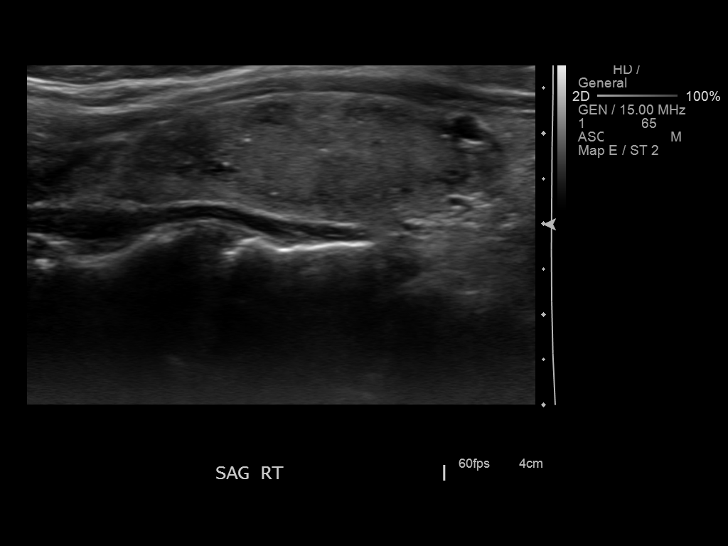
[im 19/73]
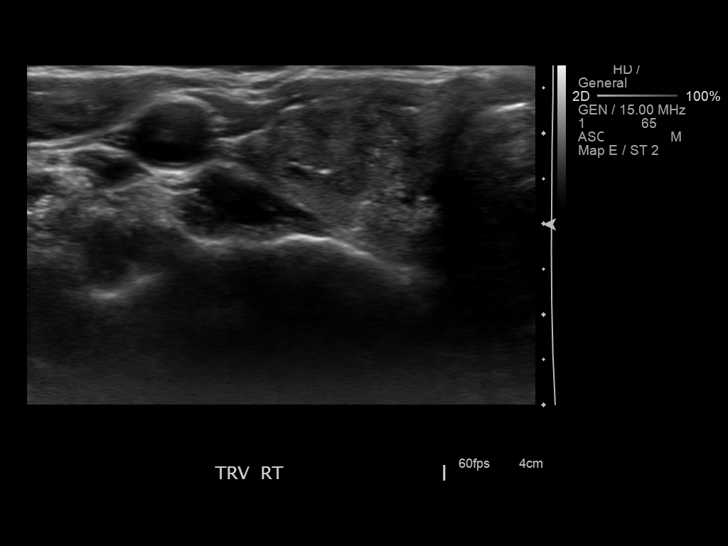
[im 25/73]
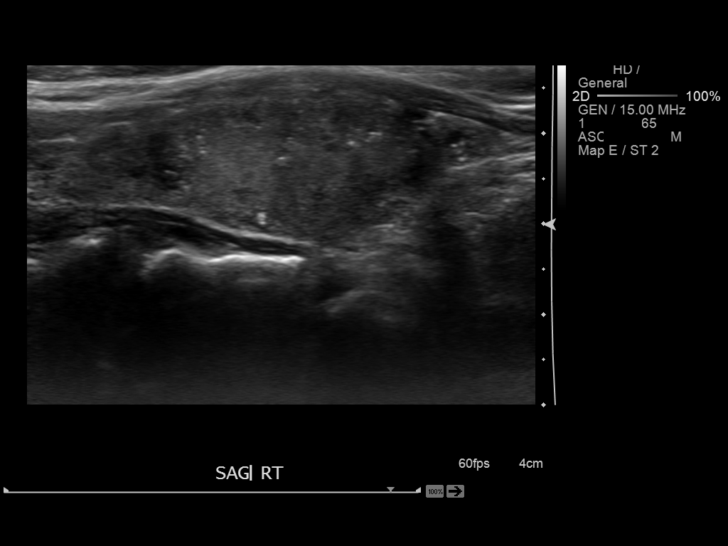
[im 28/73]
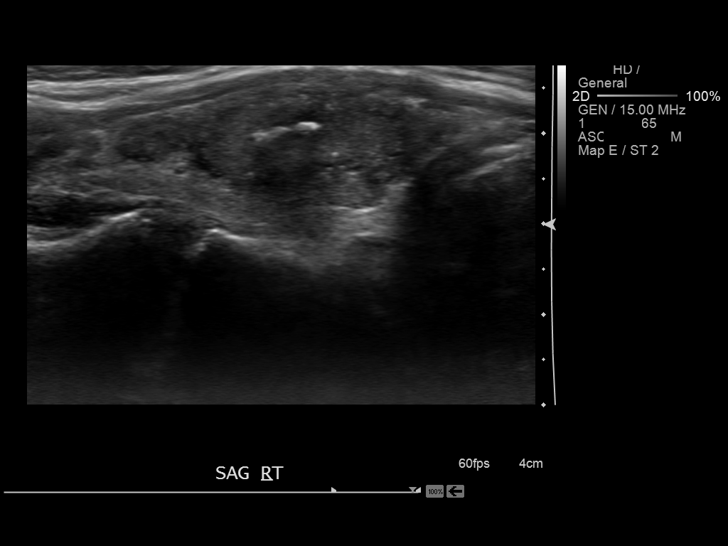
[im 34/73]
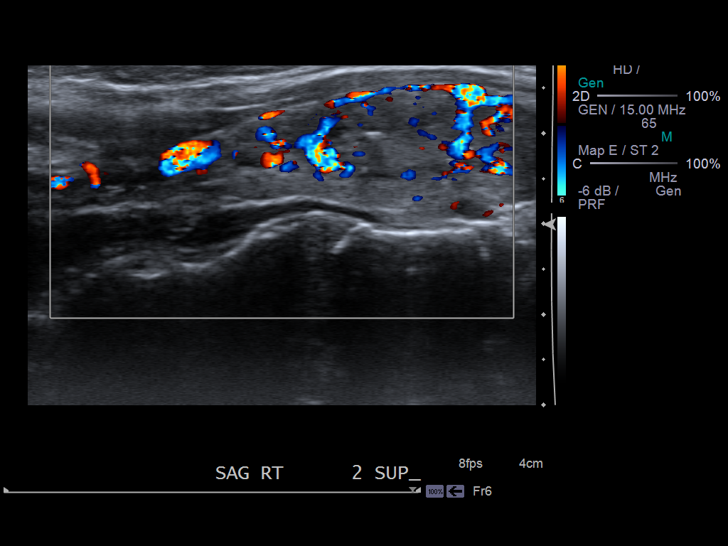
[im 40/73]
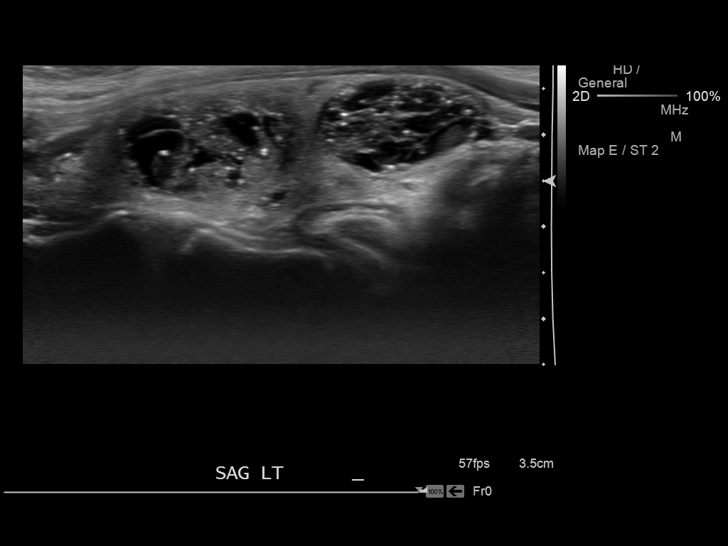
[im 46/73]
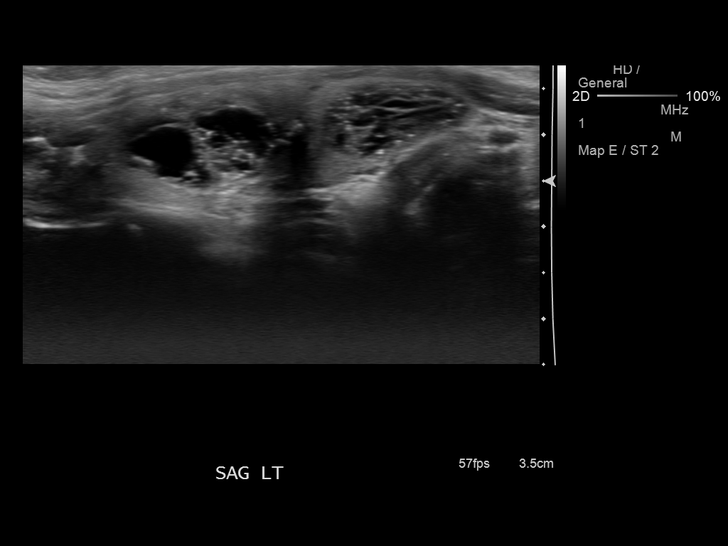
[im 49/73]
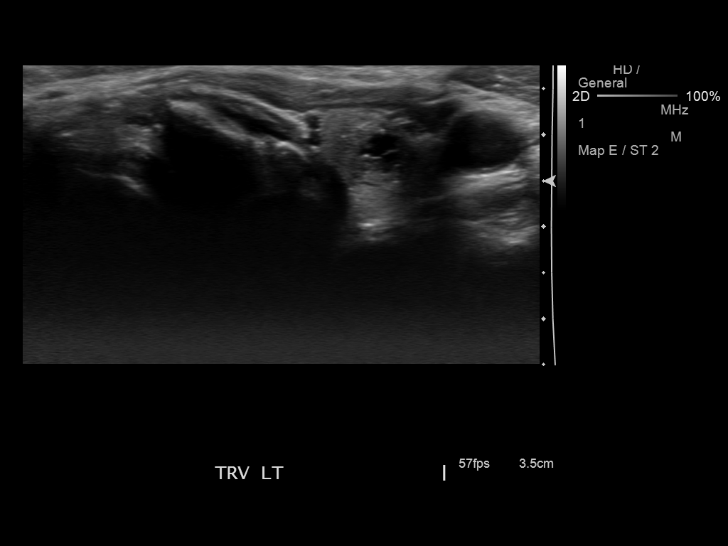
[im 55/73]
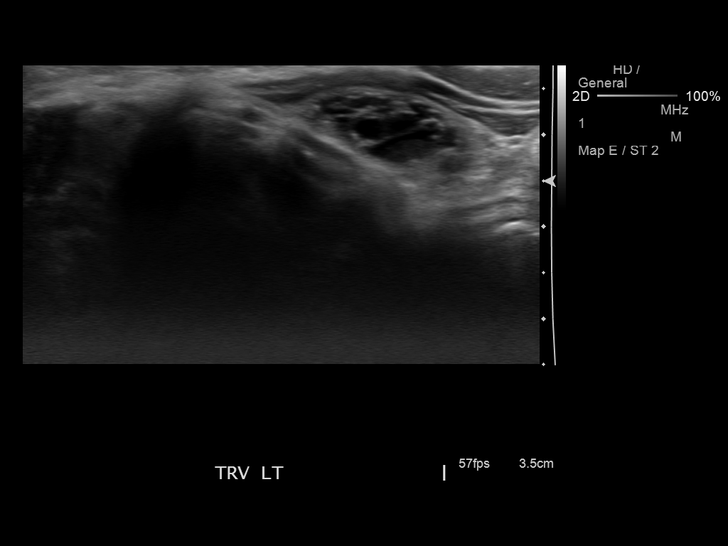
[im 61/73]
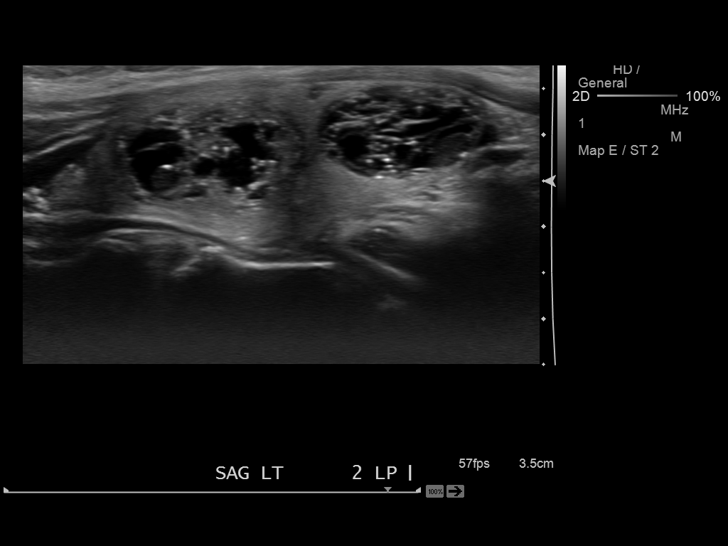
[im 67/73]
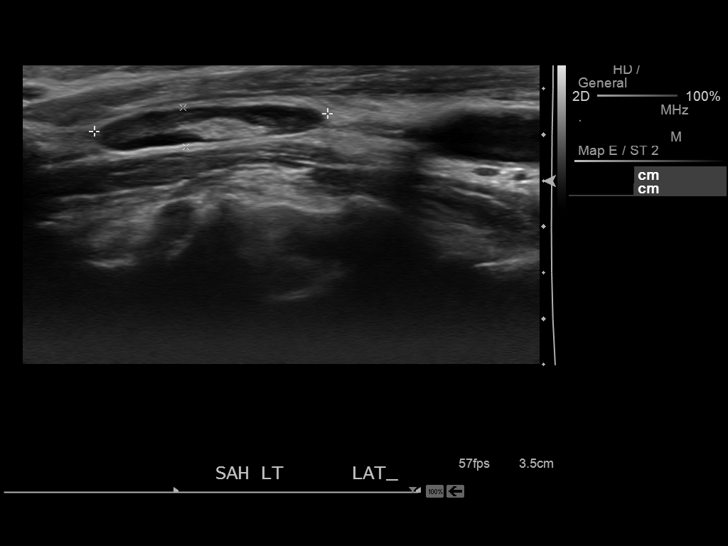
[im 73/73]
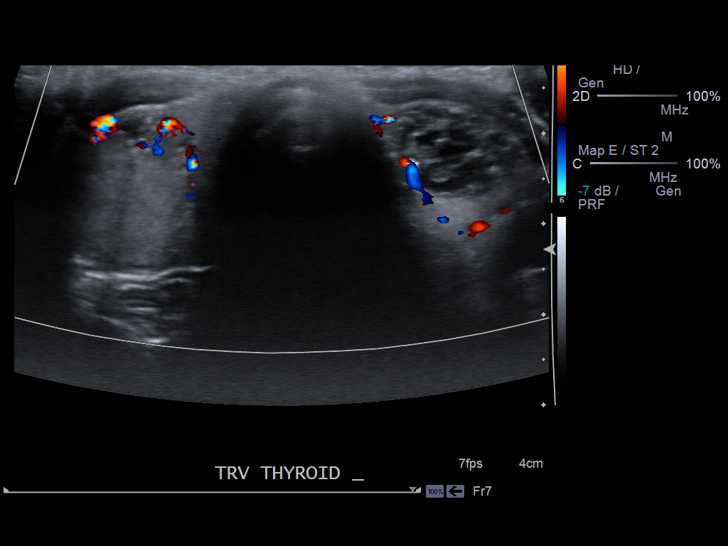

[14 of 25 positions shown; findings below may reference images not displayed]

FINDINGS: Right thyroid lobe

Measurements: 54 x 21 x 22 mm. Dominant 29 x 17 x 21 mm mostly solid
lesion with microcalcifications, mid lobe. 10 x 6 x 9 mm solid
nodule, superior pole.

Left thyroid lobe

Measurements: 50 x 21 x 16 mm. 21 x 12 x 17 mm complex mostly solid
nodule with microcalcifications, superior pole. 19 x 11 x 13 mm
complex mostly solid nodule with microcalcifications, inferior pole.

Isthmus

Thickness: 2 mm.  No nodules visualized.

Lymphadenopathy

None visualized.
IMPRESSION: 1. Thyromegaly with bilateral nodules as above. Dominant bilateral
lesions meet consensus criteria for biopsy. Ultrasound-guided fine
needle aspiration should be considered, as per the consensus
statement: Management of Thyroid Nodules Detected at US: Society of
Radiologists in Ultrasound Consensus Conference Statement. Radiology

## 2014-09-02 ENCOUNTER — Other Ambulatory Visit: Payer: Self-pay

## 2014-09-02 DIAGNOSIS — Z1231 Encounter for screening mammogram for malignant neoplasm of breast: Secondary | ICD-10-CM

## 2014-09-08 ENCOUNTER — Other Ambulatory Visit: Payer: Self-pay | Admitting: Endocrinology

## 2014-09-08 DIAGNOSIS — E049 Nontoxic goiter, unspecified: Secondary | ICD-10-CM

## 2014-09-09 ENCOUNTER — Ambulatory Visit
Admission: RE | Admit: 2014-09-09 | Discharge: 2014-09-09 | Disposition: A | Discharge status: Home / Self Care | Payer: BC Managed Care – PPO | Source: Ambulatory Visit

## 2014-09-09 DIAGNOSIS — Z1231 Encounter for screening mammogram for malignant neoplasm of breast: Secondary | ICD-10-CM

## 2014-09-23 ENCOUNTER — Ambulatory Visit
Admission: RE | Admit: 2014-09-23 | Discharge: 2014-09-23 | Disposition: A | Discharge status: Home / Self Care | Payer: BC Managed Care – PPO | Source: Ambulatory Visit | Attending: Endocrinology | Admitting: Endocrinology

## 2014-09-23 ENCOUNTER — Other Ambulatory Visit (HOSPITAL_COMMUNITY)
Admission: RE | Admit: 2014-09-23 | Discharge: 2014-09-23 | Disposition: A | Discharge status: Home / Self Care | Payer: BC Managed Care – PPO | Source: Ambulatory Visit | Attending: Interventional Radiology | Admitting: Interventional Radiology

## 2014-09-23 DIAGNOSIS — E049 Nontoxic goiter, unspecified: Secondary | ICD-10-CM

## 2014-09-23 DIAGNOSIS — E041 Nontoxic single thyroid nodule: Secondary | ICD-10-CM | POA: Insufficient documentation

## 2014-09-23 IMAGING — US US THYROID BIOPSY
1 series · 13 of 13 positions shown · non-contrast
Comparison: Thyroid ultrasound - [DATE]

MEDICATIONS:
None

COMPLICATIONS:
None immediate

INDICATION: Indeterminate thyroid nodules

EXAM:
ULTRASOUND GUIDED THYROID FINE NEEDLE ASPIRATION x2
TECHNIQUE: Informed written consent was obtained from the patient after a
discussion of the risks, benefits and alternatives to treatment.
Questions regarding the procedure were encouraged and answered. A
timeout was performed prior to the initiation of the procedure.

[Series 1: us thyroid biopsy · 0.06mm/px · 13 acquisitions, 13 frames shown]
[im 1/13]
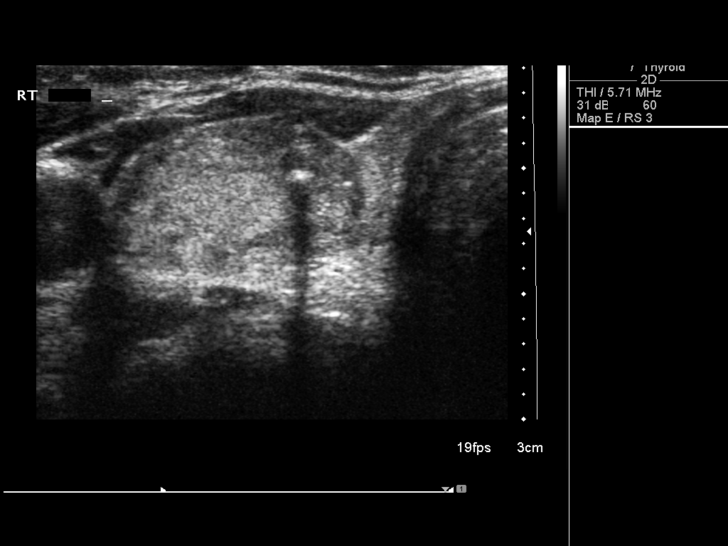
[im 2/13]
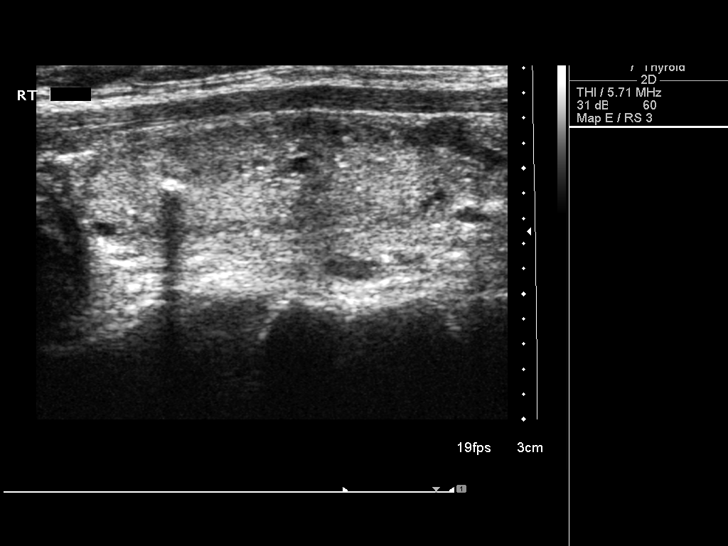
[im 3/13]
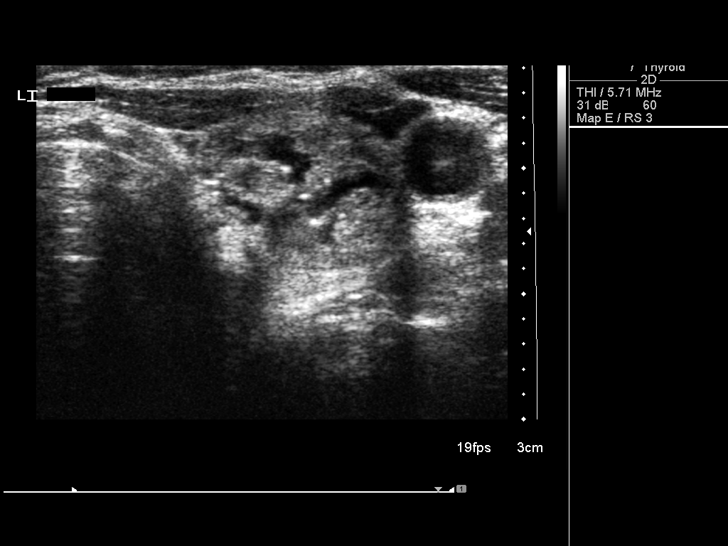
[im 4/13]
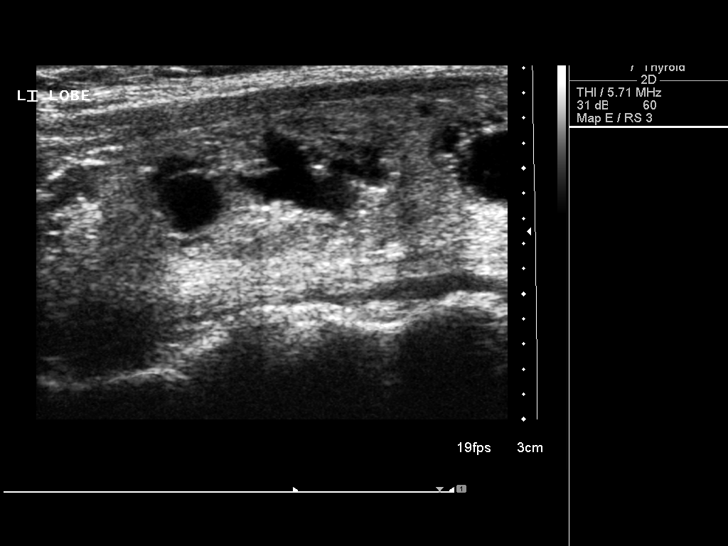
[im 5/13]
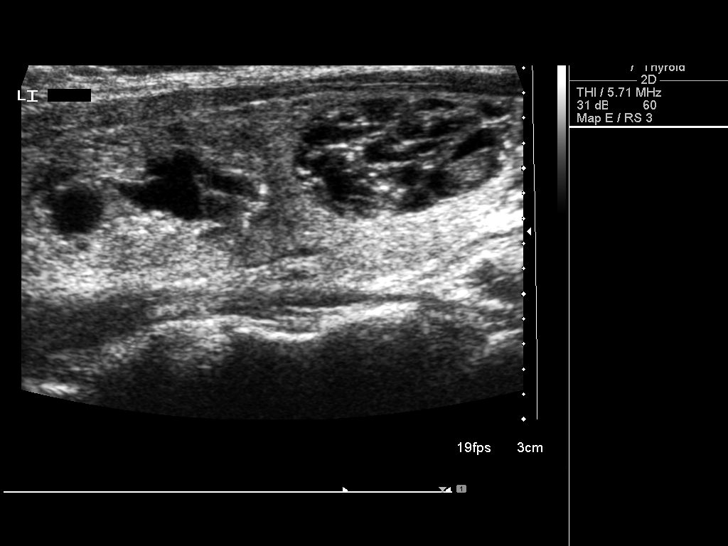
[im 6/13]
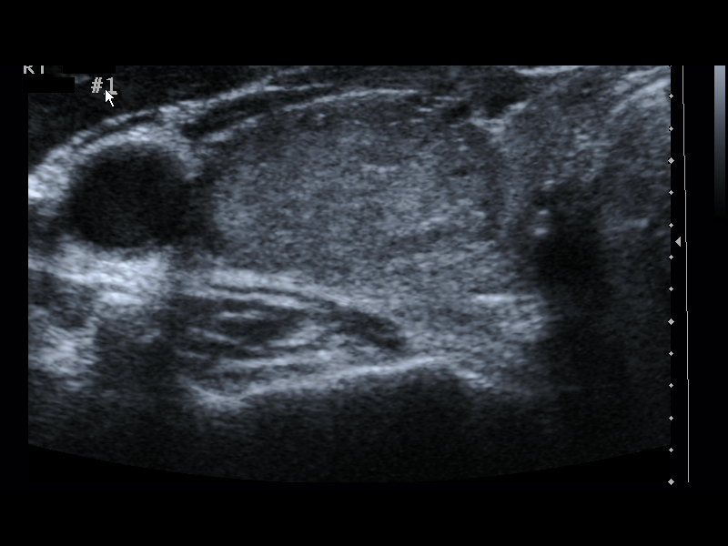
[im 7/13]
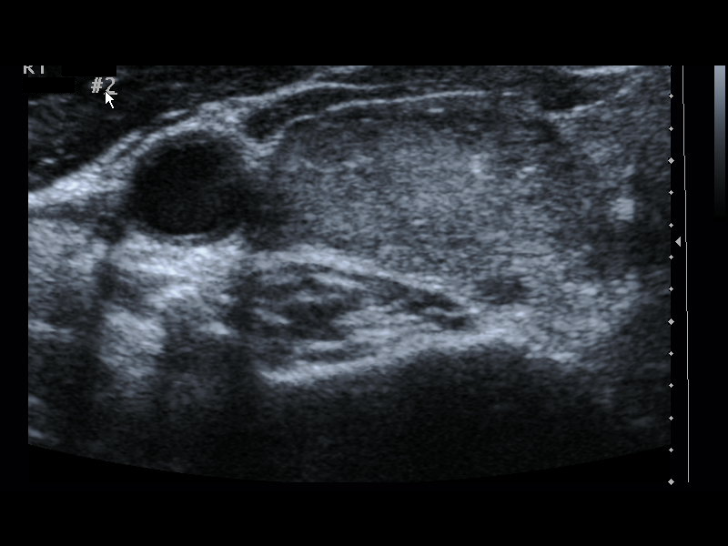
[im 8/13]
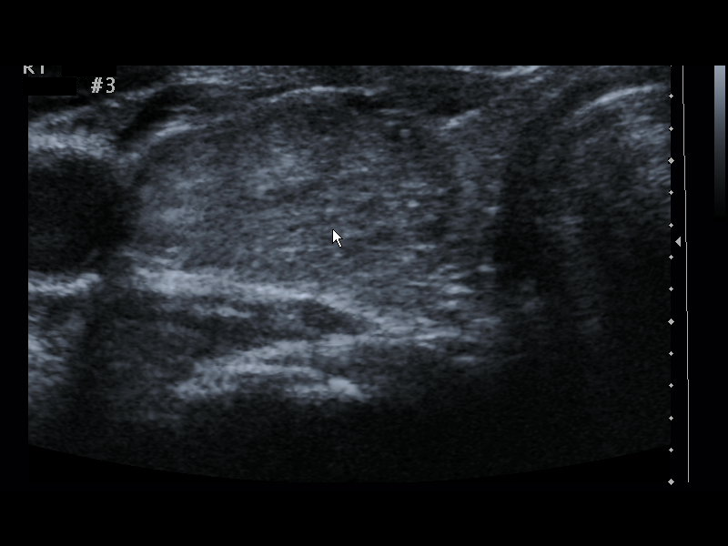
[im 9/13]
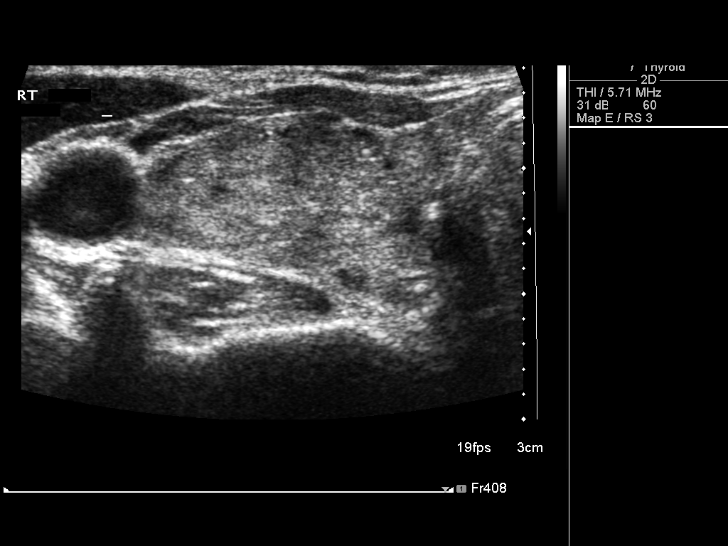
[im 10/13]
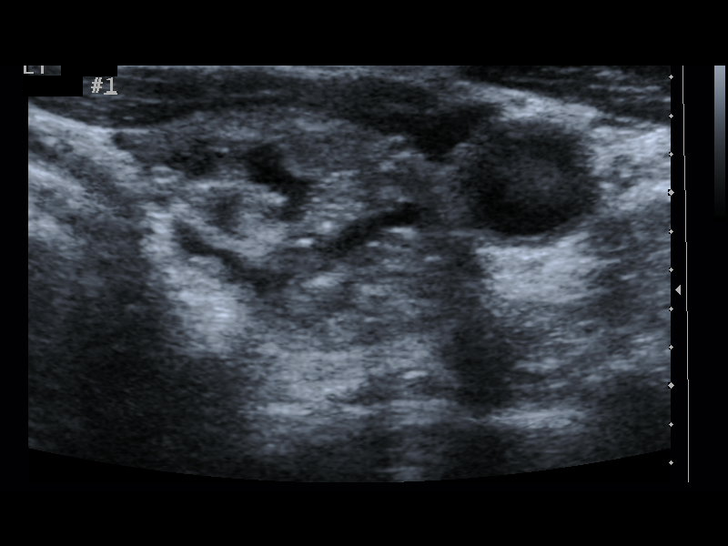
[im 11/13]
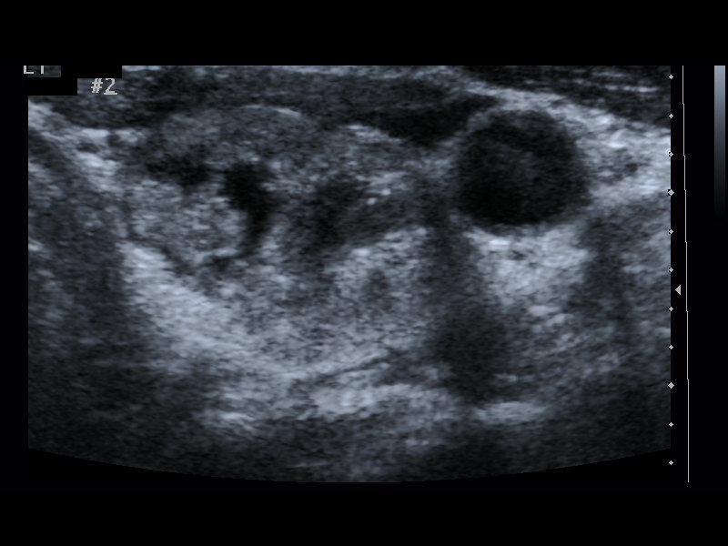
[im 12/13]
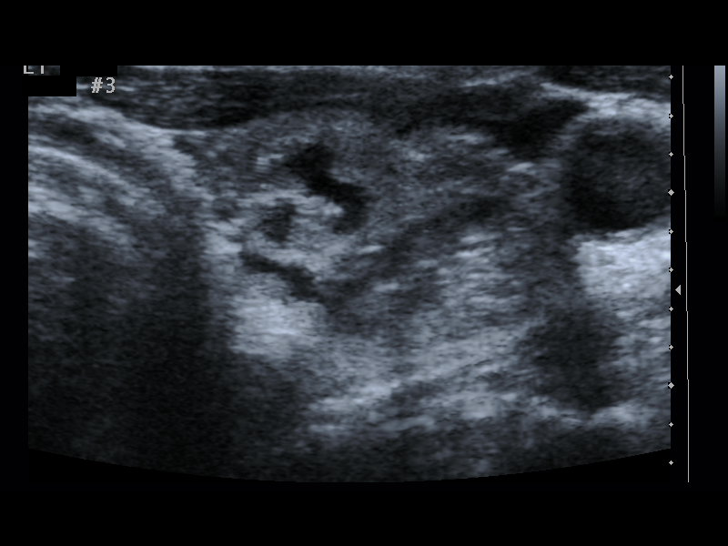
[im 13/13]
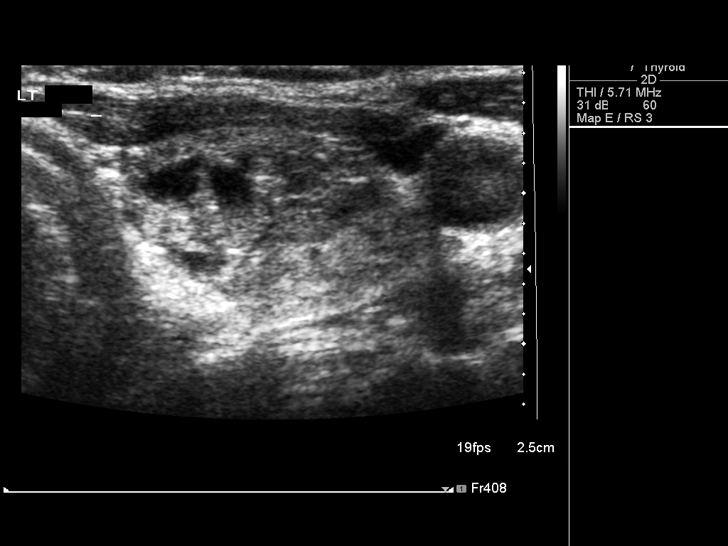

[13 of 13 positions shown; findings below may reference images not displayed]

Pre-procedural ultrasound scanning demonstrated unchanged appearance
of the dominant approximately 2.9 cm mixed echogenic nodule within
the mid aspect of the right lobe of the thyroid. This nodule was
again noted to contain multiple internal echogenic foci suggestive
of microcalcifications as well several larger shadowing dystrophic
calcifications. Unchanged appearance of the dominant approximately
2.1 cm mixed echogenic partially cystic, partially solid nodule
within the mid aspect of the left lobe of the thyroid.

The procedures were planned. The neck was prepped in the usual
sterile fashion, and a sterile drape was applied covering the
operative field. A timeout was performed prior to the initiation of
the procedure. Local anesthesia was provided with 1% lidocaine.

Under direct ultrasound guidance, 3 FNA biopsies were performed of
the dominant approximately 2.9 cm nodule within the mid aspect of
the right lobe of the thyroid with a 25 gauge needle. The samples
were prepared and submitted to pathology.

Under direct ultrasound guidance, 3 FNA biopsies were performed of
the dominant approximately 2.1 cm complex nodule within the mid
aspect of the left lobe of the thyroid with a 25 gauge needle. The
samples were prepared and submitted to pathology.

Limited post procedural scanning was negative for hematoma or
additional complication. Dressings were placed. The patient
tolerated the above procedures procedure well without immediate
postprocedural complication.
IMPRESSION: 1. Technically successful ultrasound guided fine needle aspiration
of dominant approximately 2.9 cm mixed echogenic nodule with the mid
aspect of the right lobe of the thyroid.
2. Technically successful ultrasound-guided fine-needle aspiration
of dominant approximately 2.1 cm complex partially cystic, partially
solid nodule with the mid aspect of the left lobe of the thyroid.

## 2015-04-04 DIAGNOSIS — J309 Allergic rhinitis, unspecified: Secondary | ICD-10-CM | POA: Insufficient documentation

## 2015-08-16 ENCOUNTER — Other Ambulatory Visit: Payer: Self-pay

## 2015-08-16 DIAGNOSIS — Z1231 Encounter for screening mammogram for malignant neoplasm of breast: Secondary | ICD-10-CM

## 2015-09-16 ENCOUNTER — Ambulatory Visit
Admission: RE | Admit: 2015-09-16 | Discharge: 2015-09-16 | Disposition: A | Discharge status: Home / Self Care | Payer: BC Managed Care – PPO | Source: Ambulatory Visit

## 2015-09-16 DIAGNOSIS — Z1231 Encounter for screening mammogram for malignant neoplasm of breast: Secondary | ICD-10-CM

## 2015-09-16 IMAGING — MG MM SCREENING BREAST TOMO BILATERAL
6 of 9 series · 6 of 25 positions shown · non-contrast
Comparison: Previous exam(s).

CLINICAL DATA: Screening.

EXAM:
DIGITAL SCREENING BILATERAL MAMMOGRAM WITH 3D TOMO WITH CAD

[R MLO (1 of 2)]
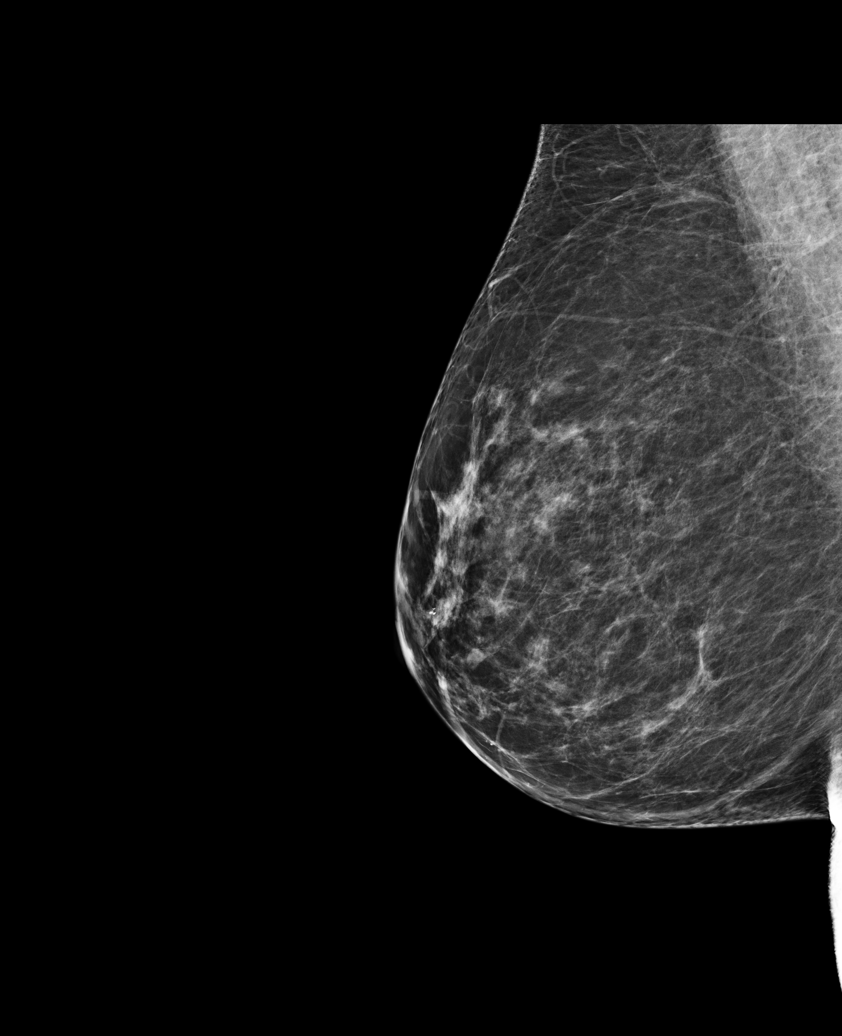

[L CC]
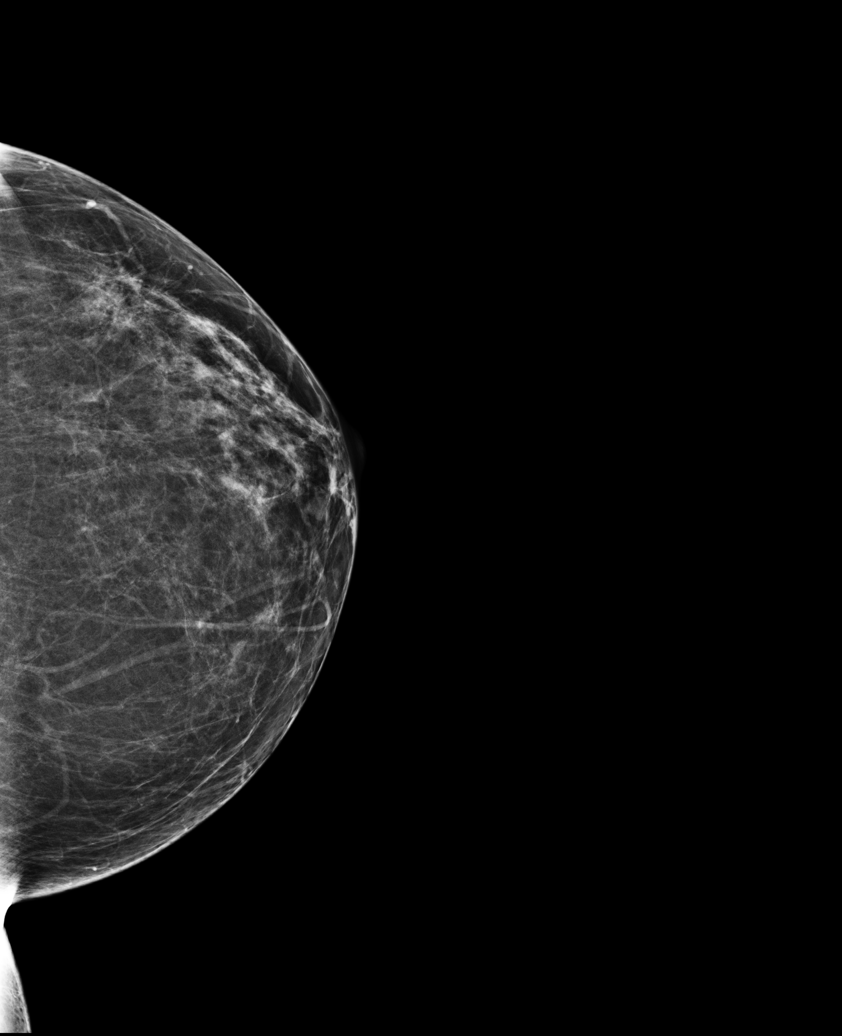

[L MLO]
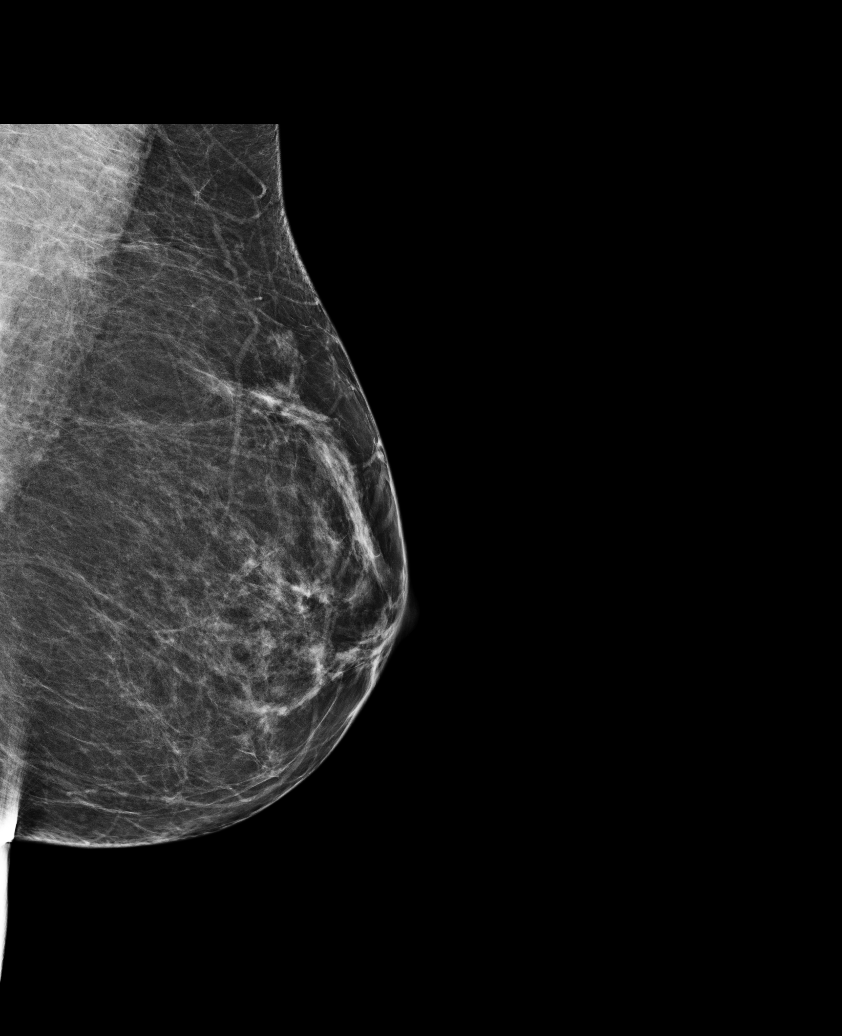

[R MLO (2 of 2)]
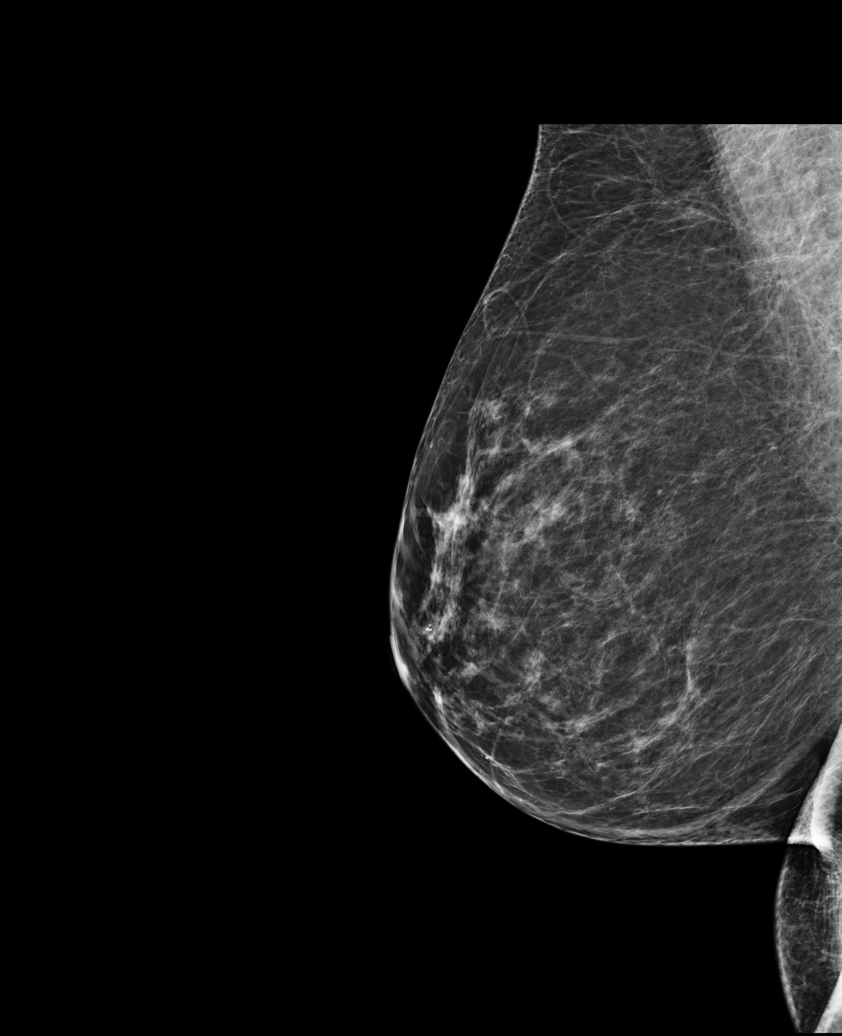

[R CC]
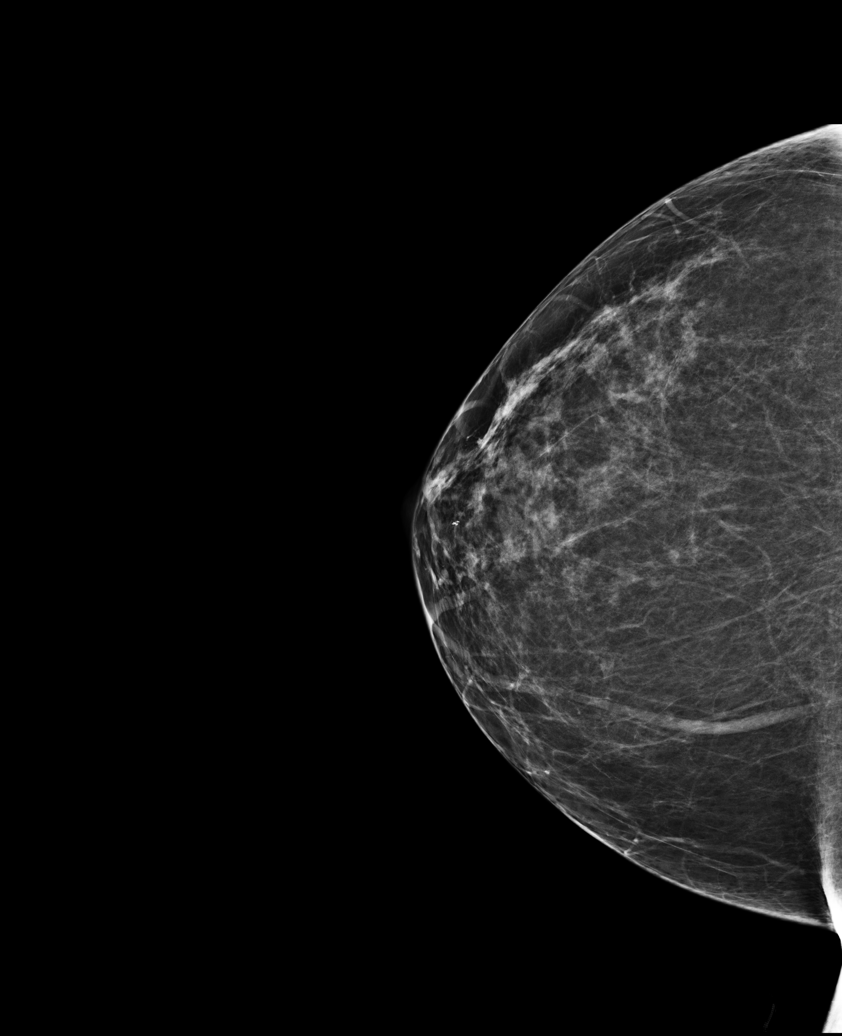

[L CC tomo · tomo slice 35/70.0]
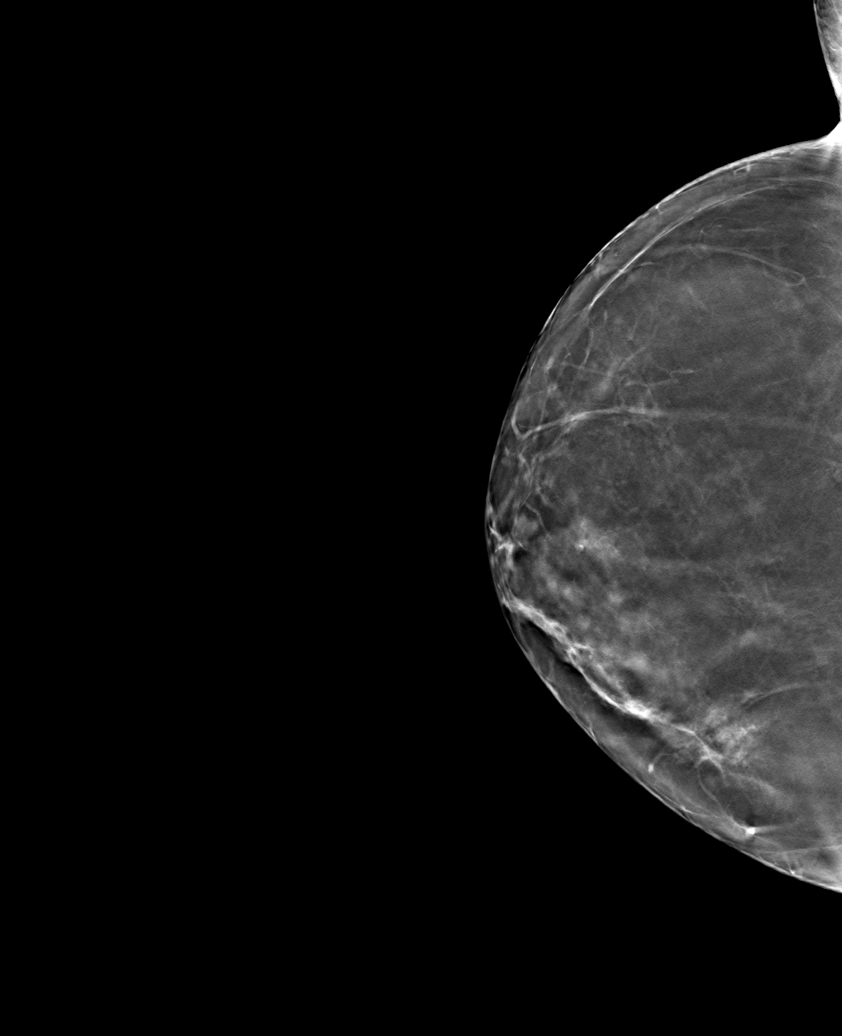

[6 of 25 positions shown; findings below may reference images not displayed]

ACR Breast Density Category b: There are scattered areas of
fibroglandular density.
FINDINGS: In the right breast, a possible mass warrants further evaluation. In
the left breast, no findings suspicious for malignancy. Images were
processed with CAD.
IMPRESSION: Further evaluation is suggested for possible mass in the right
breast.

RECOMMENDATION:
Diagnostic mammogram and possibly ultrasound of the right breast.
(Code:[JS])

The patient will be contacted regarding the findings, and additional
imaging will be scheduled.

BI-RADS CATEGORY  0: Incomplete. Need additional imaging evaluation
and/or prior mammograms for comparison.

## 2015-09-19 ENCOUNTER — Other Ambulatory Visit: Payer: Self-pay | Admitting: Internal Medicine

## 2015-09-19 DIAGNOSIS — R928 Other abnormal and inconclusive findings on diagnostic imaging of breast: Secondary | ICD-10-CM

## 2015-09-26 ENCOUNTER — Ambulatory Visit
Admission: RE | Admit: 2015-09-26 | Discharge: 2015-09-26 | Disposition: A | Discharge status: Home / Self Care | Payer: BC Managed Care – PPO | Source: Ambulatory Visit | Attending: Internal Medicine | Admitting: Internal Medicine

## 2015-09-26 DIAGNOSIS — R928 Other abnormal and inconclusive findings on diagnostic imaging of breast: Secondary | ICD-10-CM

## 2015-10-13 DIAGNOSIS — I8312 Varicose veins of left lower extremity with inflammation: Secondary | ICD-10-CM | POA: Diagnosis not present

## 2015-10-13 DIAGNOSIS — M7989 Other specified soft tissue disorders: Secondary | ICD-10-CM | POA: Diagnosis not present

## 2015-10-13 DIAGNOSIS — M79609 Pain in unspecified limb: Secondary | ICD-10-CM | POA: Diagnosis not present

## 2015-10-13 DIAGNOSIS — I890 Lymphedema, not elsewhere classified: Secondary | ICD-10-CM | POA: Diagnosis not present

## 2015-10-13 DIAGNOSIS — I8311 Varicose veins of right lower extremity with inflammation: Secondary | ICD-10-CM | POA: Diagnosis not present

## 2015-10-13 DIAGNOSIS — I8310 Varicose veins of unspecified lower extremity with inflammation: Secondary | ICD-10-CM | POA: Diagnosis not present

## 2015-10-13 DIAGNOSIS — I872 Venous insufficiency (chronic) (peripheral): Secondary | ICD-10-CM | POA: Diagnosis not present

## 2015-10-20 DIAGNOSIS — I8311 Varicose veins of right lower extremity with inflammation: Secondary | ICD-10-CM | POA: Diagnosis not present

## 2015-10-20 DIAGNOSIS — I872 Venous insufficiency (chronic) (peripheral): Secondary | ICD-10-CM | POA: Diagnosis not present

## 2015-10-20 DIAGNOSIS — I8312 Varicose veins of left lower extremity with inflammation: Secondary | ICD-10-CM | POA: Diagnosis not present

## 2015-10-20 DIAGNOSIS — M7989 Other specified soft tissue disorders: Secondary | ICD-10-CM | POA: Diagnosis not present

## 2015-10-20 DIAGNOSIS — I8310 Varicose veins of unspecified lower extremity with inflammation: Secondary | ICD-10-CM | POA: Diagnosis not present

## 2015-10-20 DIAGNOSIS — I890 Lymphedema, not elsewhere classified: Secondary | ICD-10-CM | POA: Diagnosis not present

## 2015-10-20 DIAGNOSIS — M79609 Pain in unspecified limb: Secondary | ICD-10-CM | POA: Diagnosis not present

## 2015-12-01 DIAGNOSIS — I8311 Varicose veins of right lower extremity with inflammation: Secondary | ICD-10-CM | POA: Diagnosis not present

## 2015-12-01 DIAGNOSIS — I8310 Varicose veins of unspecified lower extremity with inflammation: Secondary | ICD-10-CM | POA: Diagnosis not present

## 2015-12-01 DIAGNOSIS — I872 Venous insufficiency (chronic) (peripheral): Secondary | ICD-10-CM | POA: Diagnosis not present

## 2015-12-01 DIAGNOSIS — I8312 Varicose veins of left lower extremity with inflammation: Secondary | ICD-10-CM | POA: Diagnosis not present

## 2015-12-01 DIAGNOSIS — I890 Lymphedema, not elsewhere classified: Secondary | ICD-10-CM | POA: Diagnosis not present

## 2015-12-01 DIAGNOSIS — M7989 Other specified soft tissue disorders: Secondary | ICD-10-CM | POA: Diagnosis not present

## 2015-12-01 DIAGNOSIS — M79609 Pain in unspecified limb: Secondary | ICD-10-CM | POA: Diagnosis not present

## 2015-12-05 DIAGNOSIS — M79609 Pain in unspecified limb: Secondary | ICD-10-CM | POA: Diagnosis not present

## 2015-12-05 DIAGNOSIS — I8310 Varicose veins of unspecified lower extremity with inflammation: Secondary | ICD-10-CM | POA: Diagnosis not present

## 2015-12-05 DIAGNOSIS — M7989 Other specified soft tissue disorders: Secondary | ICD-10-CM | POA: Diagnosis not present

## 2015-12-05 DIAGNOSIS — I8312 Varicose veins of left lower extremity with inflammation: Secondary | ICD-10-CM | POA: Diagnosis not present

## 2015-12-05 DIAGNOSIS — I8311 Varicose veins of right lower extremity with inflammation: Secondary | ICD-10-CM | POA: Diagnosis not present

## 2015-12-05 DIAGNOSIS — I872 Venous insufficiency (chronic) (peripheral): Secondary | ICD-10-CM | POA: Diagnosis not present

## 2015-12-05 DIAGNOSIS — I890 Lymphedema, not elsewhere classified: Secondary | ICD-10-CM | POA: Diagnosis not present

## 2016-02-19 DIAGNOSIS — J069 Acute upper respiratory infection, unspecified: Secondary | ICD-10-CM | POA: Diagnosis not present

## 2016-02-27 ENCOUNTER — Other Ambulatory Visit: Payer: Self-pay | Admitting: Internal Medicine

## 2016-02-27 DIAGNOSIS — N6001 Solitary cyst of right breast: Secondary | ICD-10-CM

## 2016-03-05 DIAGNOSIS — I872 Venous insufficiency (chronic) (peripheral): Secondary | ICD-10-CM | POA: Diagnosis not present

## 2016-03-05 DIAGNOSIS — I8310 Varicose veins of unspecified lower extremity with inflammation: Secondary | ICD-10-CM | POA: Diagnosis not present

## 2016-03-05 DIAGNOSIS — M7989 Other specified soft tissue disorders: Secondary | ICD-10-CM | POA: Diagnosis not present

## 2016-03-05 DIAGNOSIS — I890 Lymphedema, not elsewhere classified: Secondary | ICD-10-CM | POA: Diagnosis not present

## 2016-03-05 DIAGNOSIS — M79609 Pain in unspecified limb: Secondary | ICD-10-CM | POA: Diagnosis not present

## 2016-03-05 DIAGNOSIS — I8312 Varicose veins of left lower extremity with inflammation: Secondary | ICD-10-CM | POA: Diagnosis not present

## 2016-03-05 DIAGNOSIS — I8311 Varicose veins of right lower extremity with inflammation: Secondary | ICD-10-CM | POA: Diagnosis not present

## 2016-03-09 ENCOUNTER — Other Ambulatory Visit: Payer: Self-pay | Admitting: Endocrinology

## 2016-03-09 DIAGNOSIS — E890 Postprocedural hypothyroidism: Secondary | ICD-10-CM | POA: Diagnosis not present

## 2016-03-09 DIAGNOSIS — E049 Nontoxic goiter, unspecified: Secondary | ICD-10-CM

## 2016-03-09 DIAGNOSIS — R7301 Impaired fasting glucose: Secondary | ICD-10-CM | POA: Diagnosis not present

## 2016-03-26 ENCOUNTER — Other Ambulatory Visit: Payer: BC Managed Care – PPO

## 2016-04-02 ENCOUNTER — Ambulatory Visit
Admission: RE | Admit: 2016-04-02 | Discharge: 2016-04-02 | Disposition: A | Discharge status: Home / Self Care | Payer: BC Managed Care – PPO | Source: Ambulatory Visit | Attending: Endocrinology | Admitting: Endocrinology

## 2016-04-02 ENCOUNTER — Other Ambulatory Visit: Payer: BC Managed Care – PPO

## 2016-04-02 DIAGNOSIS — E042 Nontoxic multinodular goiter: Secondary | ICD-10-CM | POA: Diagnosis not present

## 2016-04-02 DIAGNOSIS — E049 Nontoxic goiter, unspecified: Secondary | ICD-10-CM

## 2016-04-02 IMAGING — US US SOFT TISSUE HEAD/NECK
1 series · 12 of 25 positions shown · non-contrast
Comparison: [DATE]; bilateral ultrasound-guided thyroid nodule
biopsy - [DATE]

CLINICAL DATA: Thyromegaly. History of prior thyroid nodule biopsy
in [CZ].

EXAM:
THYROID ULTRASOUND
TECHNIQUE: Ultrasound examination of the thyroid gland and adjacent soft
tissues was performed.

[Series 1: us soft tissue head/neck · 0.06mm/px · 12 of 55 slices shown]
[im 3/55]
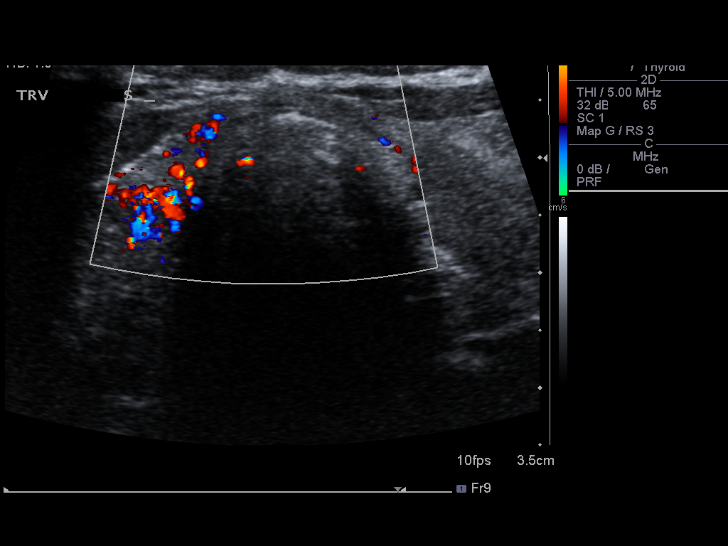
[im 7/55]
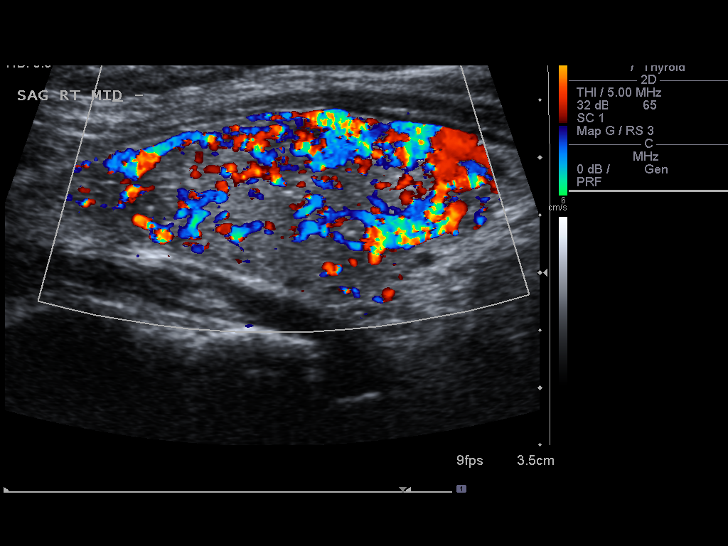
[im 12/55]
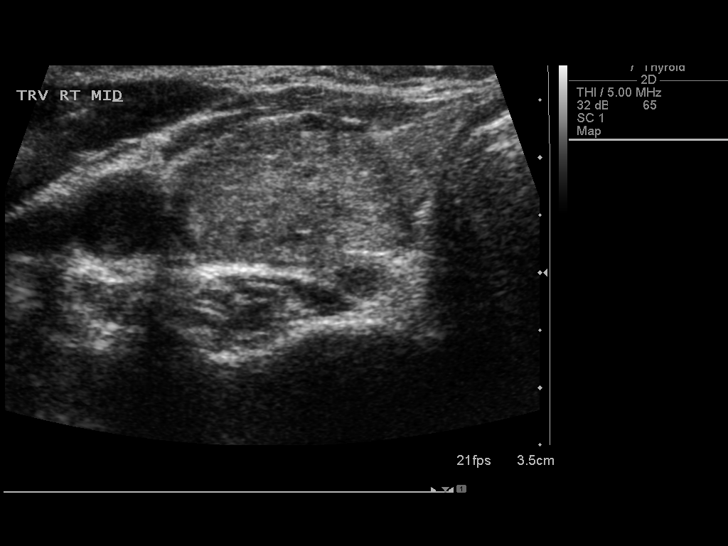
[im 16/55]
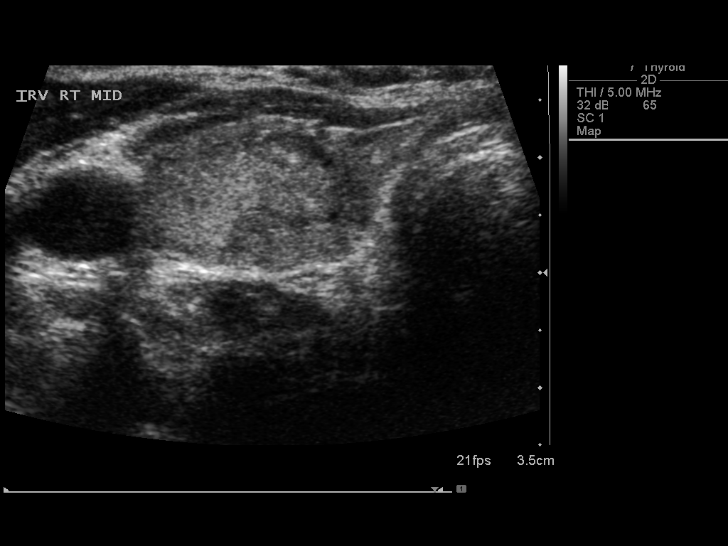
[im 21/55]
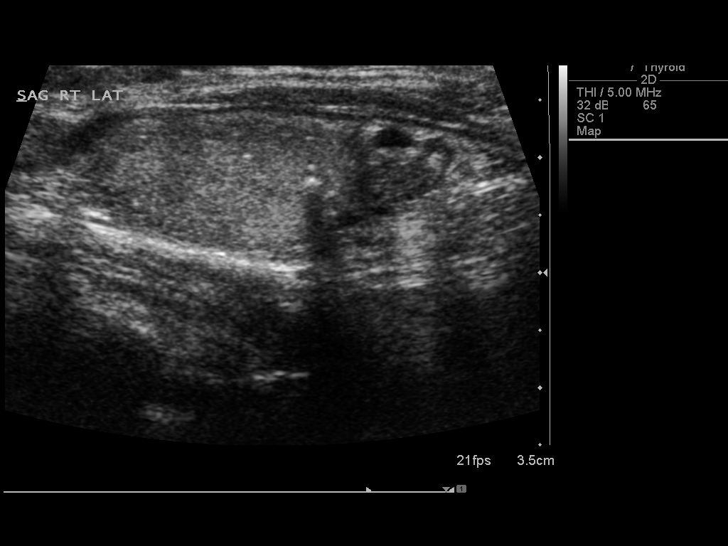
[im 25/55]
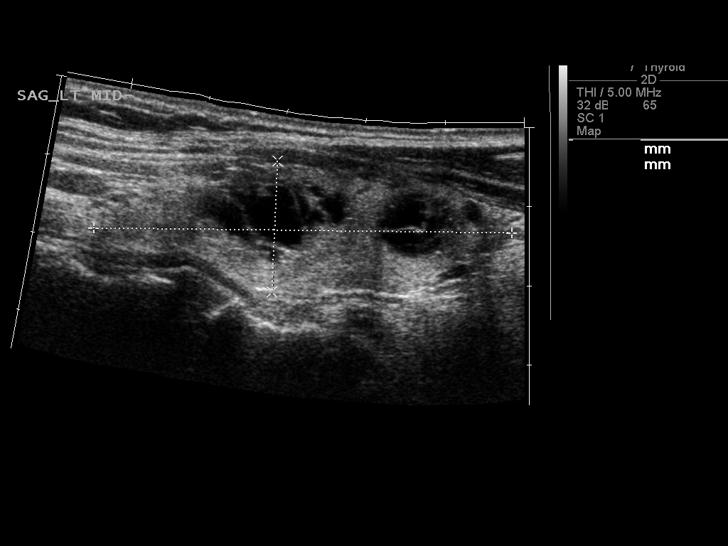
[im 30/55]
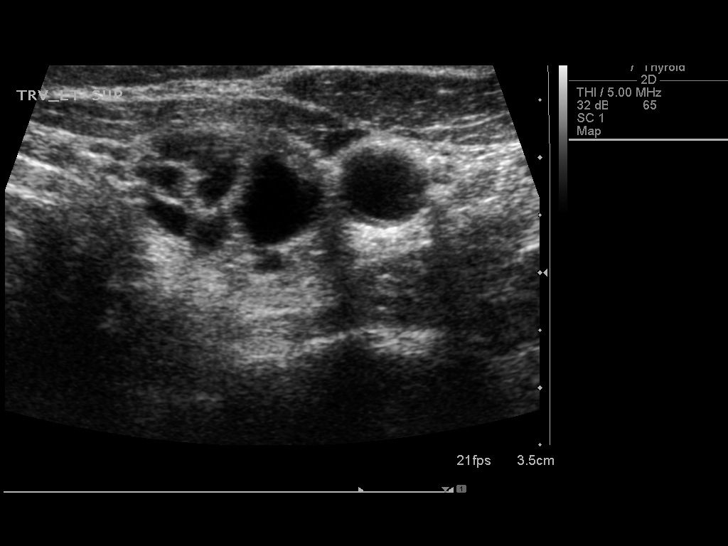
[im 34/55]
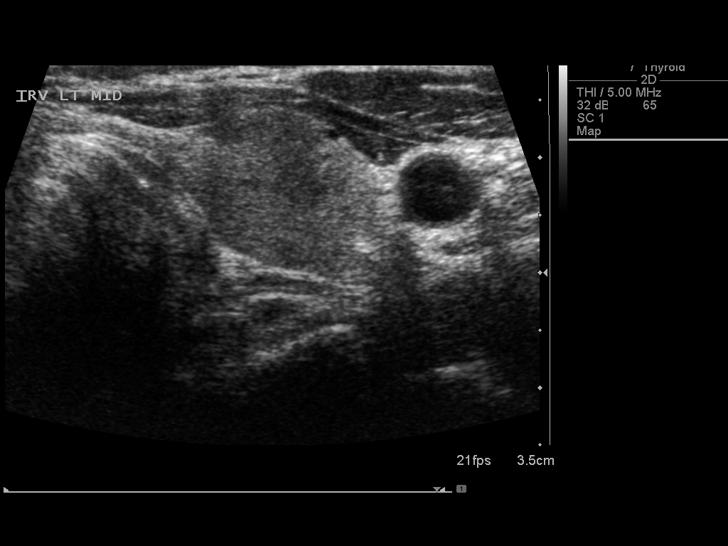
[im 39/55]
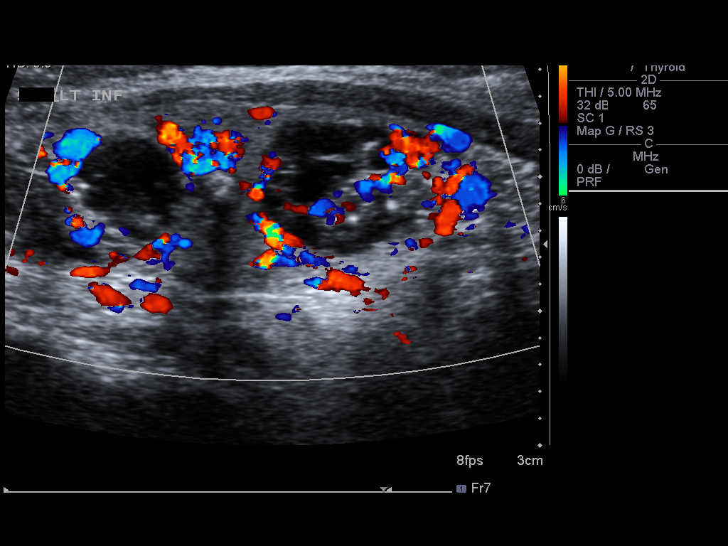
[im 43/55]
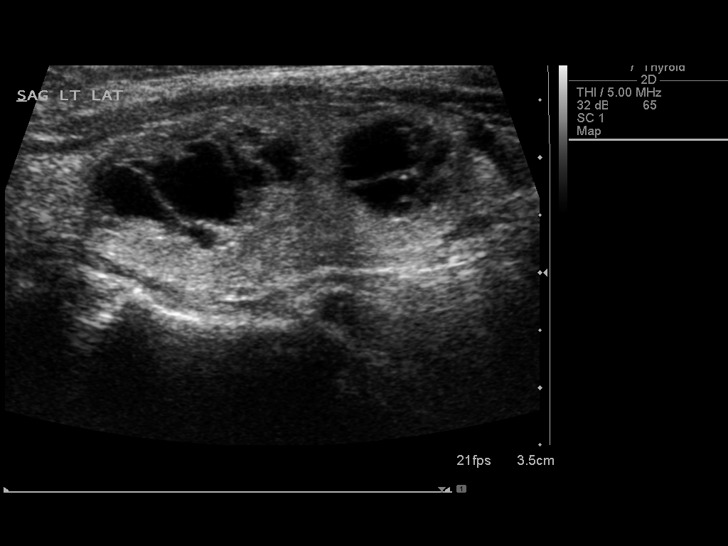
[im 48/55]
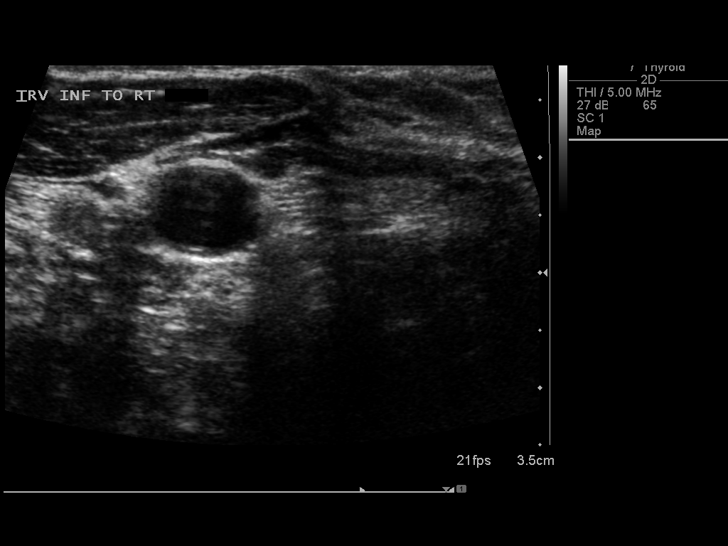
[im 52/55]
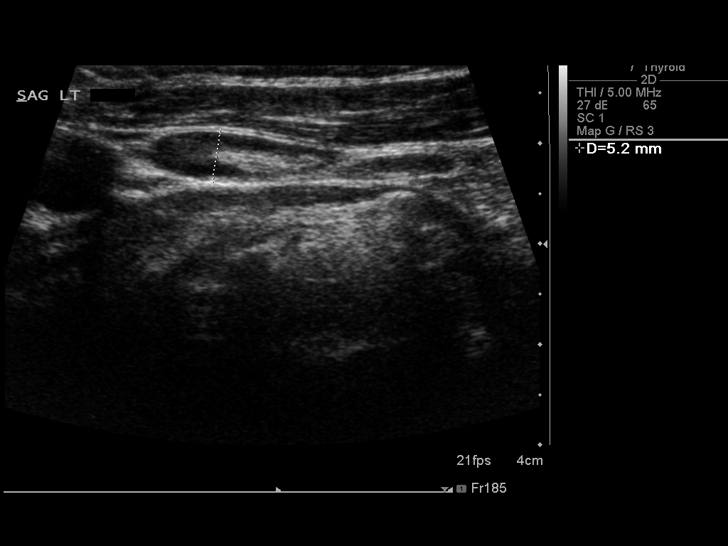

[12 of 25 positions shown; findings below may reference images not displayed]

FINDINGS: Parenchymal Echotexture: Markedly heterogenous

Estimated total number of nodules > 1 cm: <5

Number of spongiform nodules > 2 cm not described below (TR1): 0

Number of mixed cystic nodules > 1.5 cm not described below (TR2): 0

_________________________________________________________

Isthmus: Normal in size measures 0.3 cm in diameter, unchanged,
previously, 0.2 cm

No discrete nodules are identified within the thyroid isthmus.

__________________________________________________________

Right lobe: Normal in size measuring 4.5 x 1.6 x 1.9 cm, unchanged
to decreased in size in the interval, previously, 5.4 x 2.1 x 2.2 cm

Nodule # 1:

Prior biopsy: Yes

Location: Right; Mid

Size: 3.2 x 1.2 x 2.0 cm, unchanged, previously, 3.0 x 1.7 x 2.1 cm

Composition: solid/almost completely solid (2)

Echogenicity: isoechoic (1)

Shape: not taller-than-wide (0)

Margins: smooth (0)

Echogenic foci: macrocalcifications (1)

ACR TI-RADS total points: 4.

ACR TI-RADS risk category: TR4 (4-6 points).

Change in features: No

Change in ACR TI-RADS risk category: No

ACR TI-RADS recommendations:

Correlation with prior biopsy results is recommended.

_______________________________________________________________________

Left lobe: Normal in size measuring 5.3 x 1.7 x 1.9 cm, unchanged,
previously, 5.0 x 2.1 x 1.6 cm

Nodule # 1:

Prior biopsy: Yes

Location: Left; Superior

Size: 2.3 x 1.2 x 1.8 cm, unchanged, previously, 2.1 x 1.2 x 2.1 cm

Composition: mixed cystic and solid (1)

Echogenicity: isoechoic (1)

Shape: not taller-than-wide (0)

Margins: ill-defined (0)

Echogenic foci: none (0)

ACR TI-RADS total points: 2.

ACR TI-RADS risk category: TR2 (2 points).

Change in features: No

Change in ACR TI-RADS risk category: No

ACR TI-RADS recommendations:

This nodule does NOT meet TI-RADS criteria for biopsy or dedicated
follow-up.

Nodule # 2:

Prior biopsy: No

Location: Left; Inferior

Size: 1.8 x 1.0 x 1.7 cm, unchanged, previously, 1.9 x 1.1 x 1.3 cm

Composition: mixed cystic and solid (1)

Echogenicity: isoechoic (1)

Shape: not taller-than-wide (0)

Margins: ill-defined (0)

Echogenic foci: large comet-tail artifacts (0)

ACR TI-RADS total points: 2.

ACR TI-RADS risk category: TR2 (2 points).

Change in features: No

Change in ACR TI-RADS risk category: No

ACR TI-RADS recommendations:

This nodule does NOT meet TI-RADS criteria for biopsy or dedicated
follow-up.
IMPRESSION: Similar findings of multi nodular goiter. No definitive new or
enlarging thyroid nodules. Specifically, the previously biopsied
bilateral thyroid nodules are unchanged since the [DATE]
examination. Correlation with prior biopsy results is recommended.
Assuming benign pathologic diagnoses, repeat biopsy and/or dedicated
follow-up is not recommended.

The above is in keeping with the ACR TI-RADS recommendations - [HOSPITAL] [CZ];[DATE].

## 2016-04-05 DIAGNOSIS — M79609 Pain in unspecified limb: Secondary | ICD-10-CM | POA: Diagnosis not present

## 2016-04-05 DIAGNOSIS — I890 Lymphedema, not elsewhere classified: Secondary | ICD-10-CM | POA: Diagnosis not present

## 2016-04-05 DIAGNOSIS — I8310 Varicose veins of unspecified lower extremity with inflammation: Secondary | ICD-10-CM | POA: Diagnosis not present

## 2016-04-05 DIAGNOSIS — I872 Venous insufficiency (chronic) (peripheral): Secondary | ICD-10-CM | POA: Diagnosis not present

## 2016-04-05 DIAGNOSIS — M7989 Other specified soft tissue disorders: Secondary | ICD-10-CM | POA: Diagnosis not present

## 2016-04-05 DIAGNOSIS — I8312 Varicose veins of left lower extremity with inflammation: Secondary | ICD-10-CM | POA: Diagnosis not present

## 2016-04-05 DIAGNOSIS — I8311 Varicose veins of right lower extremity with inflammation: Secondary | ICD-10-CM | POA: Diagnosis not present

## 2016-04-06 ENCOUNTER — Ambulatory Visit
Admission: RE | Admit: 2016-04-06 | Discharge: 2016-04-06 | Disposition: A | Discharge status: Home / Self Care | Payer: PPO | Source: Ambulatory Visit | Attending: Internal Medicine | Admitting: Internal Medicine

## 2016-04-06 DIAGNOSIS — N6011 Diffuse cystic mastopathy of right breast: Secondary | ICD-10-CM | POA: Diagnosis not present

## 2016-04-06 DIAGNOSIS — N6001 Solitary cyst of right breast: Secondary | ICD-10-CM

## 2016-04-26 DIAGNOSIS — M7989 Other specified soft tissue disorders: Secondary | ICD-10-CM | POA: Diagnosis not present

## 2016-04-26 DIAGNOSIS — I8311 Varicose veins of right lower extremity with inflammation: Secondary | ICD-10-CM | POA: Diagnosis not present

## 2016-04-26 DIAGNOSIS — I872 Venous insufficiency (chronic) (peripheral): Secondary | ICD-10-CM | POA: Diagnosis not present

## 2016-04-26 DIAGNOSIS — M79609 Pain in unspecified limb: Secondary | ICD-10-CM | POA: Diagnosis not present

## 2016-04-26 DIAGNOSIS — I890 Lymphedema, not elsewhere classified: Secondary | ICD-10-CM | POA: Diagnosis not present

## 2016-04-26 DIAGNOSIS — I8312 Varicose veins of left lower extremity with inflammation: Secondary | ICD-10-CM | POA: Diagnosis not present

## 2016-04-26 DIAGNOSIS — I8310 Varicose veins of unspecified lower extremity with inflammation: Secondary | ICD-10-CM | POA: Diagnosis not present

## 2016-05-17 ENCOUNTER — Encounter (INDEPENDENT_AMBULATORY_CARE_PROVIDER_SITE_OTHER): Payer: Self-pay | Admitting: Vascular Surgery

## 2016-05-17 ENCOUNTER — Ambulatory Visit (INDEPENDENT_AMBULATORY_CARE_PROVIDER_SITE_OTHER): Payer: PPO | Admitting: Vascular Surgery

## 2016-05-17 DIAGNOSIS — I8312 Varicose veins of left lower extremity with inflammation: Secondary | ICD-10-CM | POA: Diagnosis not present

## 2016-05-17 DIAGNOSIS — I890 Lymphedema, not elsewhere classified: Secondary | ICD-10-CM | POA: Insufficient documentation

## 2016-05-17 DIAGNOSIS — M79605 Pain in left leg: Secondary | ICD-10-CM

## 2016-05-17 DIAGNOSIS — I8311 Varicose veins of right lower extremity with inflammation: Secondary | ICD-10-CM | POA: Diagnosis not present

## 2016-05-17 DIAGNOSIS — M79604 Pain in right leg: Secondary | ICD-10-CM

## 2016-05-17 DIAGNOSIS — I872 Venous insufficiency (chronic) (peripheral): Secondary | ICD-10-CM | POA: Diagnosis not present

## 2016-05-17 DIAGNOSIS — M79606 Pain in leg, unspecified: Secondary | ICD-10-CM | POA: Insufficient documentation

## 2016-05-17 NOTE — Progress Notes (Deleted)
Procedure: Hypertonic Saline Sclerotherapy {Right/Left/Bilateral:[id number]}  Indication: Patient presents with symptomatic varicose veins of the Right  Procedure: Sclerotherapy using hypertonic saline mixed with 1% Lidocaine was performed on the Right. Compression wraps were placed. The patient tolerated the procedure well.  Plan: Follow up as needed.

## 2016-05-17 NOTE — Progress Notes (Signed)
1.  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 Patient will continue wearing graduated compression stockings class 1 on a daily basis, beginning first thing in the morning and removing them in the evening.  In addition, behavioral modification including elevation during the day was again discussed and this will continue.  The patient has utilized over the counter pain medications and has been exercising.  However, at this time conservative therapy has not alleviated the patient's symptoms of leg pain and swelling  Laser ablation of the right and left small saphenous veins to eliminate the symptoms of pain and swelling of the lower extremities caused by the severe superficial venous reflux disease was recommended.  Subsequently the patient underwent  On 10/13/2015 which was successful and then laser ablation of the left small saphenous vein on 12/01/2015 which was also successful. Subsequently she has undergone 3 sessions of sclerotherapy. She presents today for further injection sclerotherapy.   Procedure:  Sclerotherapy using hypertonic saline mixed with 1% Lidocaine was performed on lower extremities bilateral.  Compression wraps were placed.  The patient tolerated the procedure well.  Plan:  Follow up as arranged    2.  Swelling of limb (729.81  M79.89) Story: Conservative therapy for three months with follow up to assess progress.  3.  Venous insufficiency (459.81  I87.2)  4.  Pain in limb (729.5  M79.609)  5.  Hypothyroidism  6.  Lymphedema (457.1  I89.0)

## 2016-05-28 ENCOUNTER — Ambulatory Visit (INDEPENDENT_AMBULATORY_CARE_PROVIDER_SITE_OTHER): Payer: PPO | Admitting: Vascular Surgery

## 2016-05-28 ENCOUNTER — Encounter (INDEPENDENT_AMBULATORY_CARE_PROVIDER_SITE_OTHER): Payer: Self-pay | Admitting: Vascular Surgery

## 2016-05-28 VITALS — BP 136/73 | HR 79 | Wt 190.0 lb

## 2016-05-28 DIAGNOSIS — M79604 Pain in right leg: Secondary | ICD-10-CM

## 2016-05-28 DIAGNOSIS — M79605 Pain in left leg: Secondary | ICD-10-CM

## 2016-05-28 DIAGNOSIS — I8311 Varicose veins of right lower extremity with inflammation: Secondary | ICD-10-CM

## 2016-05-28 DIAGNOSIS — I8312 Varicose veins of left lower extremity with inflammation: Secondary | ICD-10-CM

## 2016-05-28 DIAGNOSIS — I872 Venous insufficiency (chronic) (peripheral): Secondary | ICD-10-CM

## 2016-05-28 NOTE — Progress Notes (Signed)
Procedure:  Sclerotherapy using hypertonic saline mixed with 1% Lidocaine was performed on lower extremities bilateral.  Compression wraps were placed.  The patient tolerated the procedure well.  Plan:  Follow up as arranged

## 2016-06-11 ENCOUNTER — Encounter (INDEPENDENT_AMBULATORY_CARE_PROVIDER_SITE_OTHER): Payer: Self-pay | Admitting: Vascular Surgery

## 2016-06-11 ENCOUNTER — Ambulatory Visit (INDEPENDENT_AMBULATORY_CARE_PROVIDER_SITE_OTHER): Payer: PPO | Admitting: Vascular Surgery

## 2016-06-11 VITALS — BP 138/80 | HR 71 | Resp 16 | Ht 71.0 in | Wt 190.0 lb

## 2016-06-11 DIAGNOSIS — I890 Lymphedema, not elsewhere classified: Secondary | ICD-10-CM

## 2016-06-11 DIAGNOSIS — I872 Venous insufficiency (chronic) (peripheral): Secondary | ICD-10-CM

## 2016-06-11 DIAGNOSIS — I8312 Varicose veins of left lower extremity with inflammation: Secondary | ICD-10-CM | POA: Diagnosis not present

## 2016-06-11 DIAGNOSIS — I8311 Varicose veins of right lower extremity with inflammation: Secondary | ICD-10-CM

## 2016-06-11 DIAGNOSIS — M79604 Pain in right leg: Secondary | ICD-10-CM

## 2016-06-11 DIAGNOSIS — M79605 Pain in left leg: Secondary | ICD-10-CM

## 2016-06-11 NOTE — Progress Notes (Signed)
Procedure:  Sclerotherapy using hypertonic saline mixed with 1% Lidocaine was performed on lower extremities bilateral.  Compression wraps were placed.  The patient tolerated the procedure well.  Plan:  Follow up as arranged

## 2016-06-22 DIAGNOSIS — D225 Melanocytic nevi of trunk: Secondary | ICD-10-CM | POA: Diagnosis not present

## 2016-06-22 DIAGNOSIS — D485 Neoplasm of uncertain behavior of skin: Secondary | ICD-10-CM | POA: Diagnosis not present

## 2016-06-22 DIAGNOSIS — L820 Inflamed seborrheic keratosis: Secondary | ICD-10-CM | POA: Diagnosis not present

## 2016-06-22 DIAGNOSIS — L821 Other seborrheic keratosis: Secondary | ICD-10-CM | POA: Diagnosis not present

## 2016-06-22 DIAGNOSIS — D229 Melanocytic nevi, unspecified: Secondary | ICD-10-CM | POA: Diagnosis not present

## 2016-06-22 DIAGNOSIS — L578 Other skin changes due to chronic exposure to nonionizing radiation: Secondary | ICD-10-CM | POA: Diagnosis not present

## 2016-06-22 DIAGNOSIS — L814 Other melanin hyperpigmentation: Secondary | ICD-10-CM | POA: Diagnosis not present

## 2016-07-12 DIAGNOSIS — E890 Postprocedural hypothyroidism: Secondary | ICD-10-CM | POA: Diagnosis not present

## 2016-08-31 DIAGNOSIS — M25562 Pain in left knee: Secondary | ICD-10-CM | POA: Diagnosis not present

## 2016-09-28 DIAGNOSIS — M1712 Unilateral primary osteoarthritis, left knee: Secondary | ICD-10-CM | POA: Diagnosis not present

## 2016-10-16 ENCOUNTER — Other Ambulatory Visit: Payer: Self-pay | Admitting: Internal Medicine

## 2016-10-16 DIAGNOSIS — N6001 Solitary cyst of right breast: Secondary | ICD-10-CM

## 2016-10-17 DIAGNOSIS — J069 Acute upper respiratory infection, unspecified: Secondary | ICD-10-CM | POA: Diagnosis not present

## 2016-11-16 DIAGNOSIS — M1712 Unilateral primary osteoarthritis, left knee: Secondary | ICD-10-CM | POA: Diagnosis not present

## 2016-11-23 DIAGNOSIS — M1712 Unilateral primary osteoarthritis, left knee: Secondary | ICD-10-CM | POA: Diagnosis not present

## 2016-12-07 DIAGNOSIS — M1712 Unilateral primary osteoarthritis, left knee: Secondary | ICD-10-CM | POA: Diagnosis not present

## 2016-12-10 ENCOUNTER — Ambulatory Visit
Admission: RE | Admit: 2016-12-10 | Discharge: 2016-12-10 | Disposition: A | Discharge status: Home / Self Care | Payer: PPO | Source: Ambulatory Visit | Attending: Internal Medicine | Admitting: Internal Medicine

## 2016-12-10 DIAGNOSIS — N6001 Solitary cyst of right breast: Secondary | ICD-10-CM | POA: Diagnosis not present

## 2016-12-10 DIAGNOSIS — R928 Other abnormal and inconclusive findings on diagnostic imaging of breast: Secondary | ICD-10-CM | POA: Diagnosis not present

## 2016-12-14 DIAGNOSIS — M1712 Unilateral primary osteoarthritis, left knee: Secondary | ICD-10-CM | POA: Diagnosis not present

## 2016-12-17 ENCOUNTER — Other Ambulatory Visit: Payer: PPO

## 2016-12-24 DIAGNOSIS — M1712 Unilateral primary osteoarthritis, left knee: Secondary | ICD-10-CM | POA: Diagnosis not present

## 2017-03-13 DIAGNOSIS — R635 Abnormal weight gain: Secondary | ICD-10-CM | POA: Diagnosis not present

## 2017-03-13 DIAGNOSIS — E890 Postprocedural hypothyroidism: Secondary | ICD-10-CM | POA: Diagnosis not present

## 2017-03-13 DIAGNOSIS — E049 Nontoxic goiter, unspecified: Secondary | ICD-10-CM | POA: Diagnosis not present

## 2017-03-13 DIAGNOSIS — R7301 Impaired fasting glucose: Secondary | ICD-10-CM | POA: Diagnosis not present

## 2017-06-05 DIAGNOSIS — B9689 Other specified bacterial agents as the cause of diseases classified elsewhere: Secondary | ICD-10-CM | POA: Diagnosis not present

## 2017-06-05 DIAGNOSIS — J019 Acute sinusitis, unspecified: Secondary | ICD-10-CM | POA: Diagnosis not present

## 2017-06-25 DIAGNOSIS — J301 Allergic rhinitis due to pollen: Secondary | ICD-10-CM | POA: Diagnosis not present

## 2017-06-25 DIAGNOSIS — R05 Cough: Secondary | ICD-10-CM | POA: Diagnosis not present

## 2017-06-25 DIAGNOSIS — J3089 Other allergic rhinitis: Secondary | ICD-10-CM | POA: Diagnosis not present

## 2017-06-25 DIAGNOSIS — J309 Allergic rhinitis, unspecified: Secondary | ICD-10-CM | POA: Diagnosis not present

## 2017-10-31 DIAGNOSIS — J3089 Other allergic rhinitis: Secondary | ICD-10-CM | POA: Diagnosis not present

## 2017-10-31 DIAGNOSIS — H1045 Other chronic allergic conjunctivitis: Secondary | ICD-10-CM | POA: Diagnosis not present

## 2017-10-31 DIAGNOSIS — J301 Allergic rhinitis due to pollen: Secondary | ICD-10-CM | POA: Diagnosis not present

## 2017-10-31 DIAGNOSIS — R05 Cough: Secondary | ICD-10-CM | POA: Diagnosis not present

## 2017-12-10 ENCOUNTER — Other Ambulatory Visit: Payer: Self-pay | Admitting: Internal Medicine

## 2017-12-10 DIAGNOSIS — Z1231 Encounter for screening mammogram for malignant neoplasm of breast: Secondary | ICD-10-CM

## 2017-12-26 ENCOUNTER — Ambulatory Visit: Payer: PPO

## 2018-01-30 ENCOUNTER — Ambulatory Visit
Admission: RE | Admit: 2018-01-30 | Discharge: 2018-01-30 | Disposition: A | Discharge status: Home / Self Care | Payer: PPO | Source: Ambulatory Visit | Attending: Internal Medicine | Admitting: Internal Medicine

## 2018-01-30 DIAGNOSIS — Z1231 Encounter for screening mammogram for malignant neoplasm of breast: Secondary | ICD-10-CM | POA: Diagnosis not present

## 2018-01-30 IMAGING — MG DIGITAL SCREENING BILATERAL MAMMOGRAM WITH TOMO AND CAD
8 series · 9 of 24 positions shown · non-contrast
Comparison: Previous exam(s).

CLINICAL DATA: Screening.

EXAM:
DIGITAL SCREENING BILATERAL MAMMOGRAM WITH TOMO AND CAD

[L MLO synth-2D]
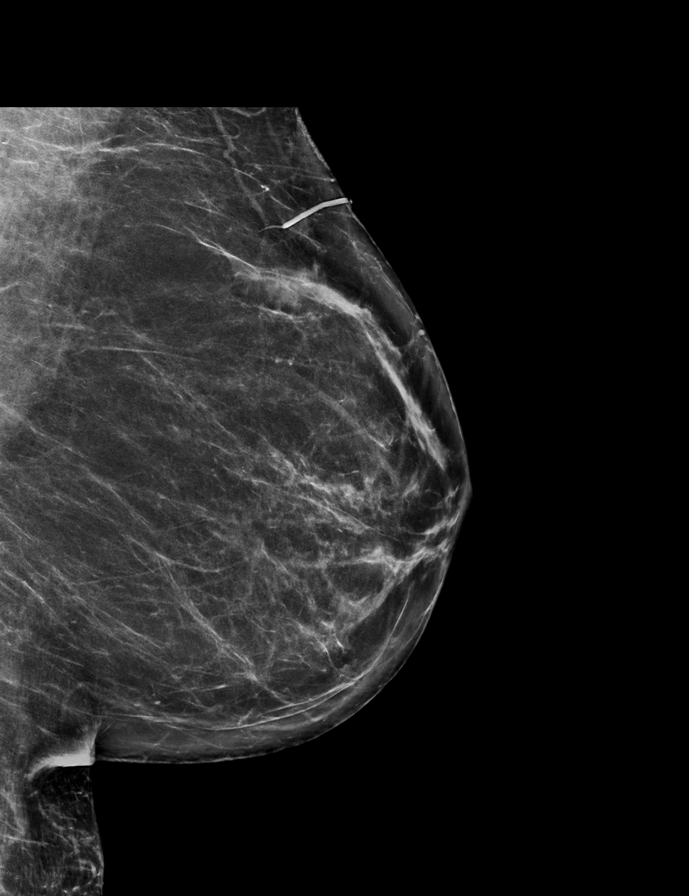

[R CC synth-2D]
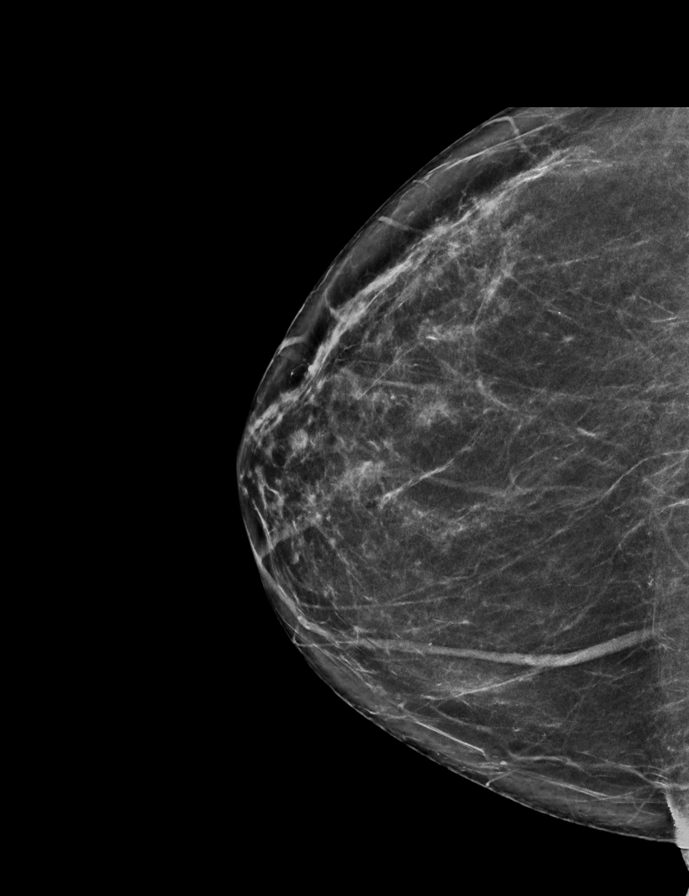

[R MLO synth-2D]
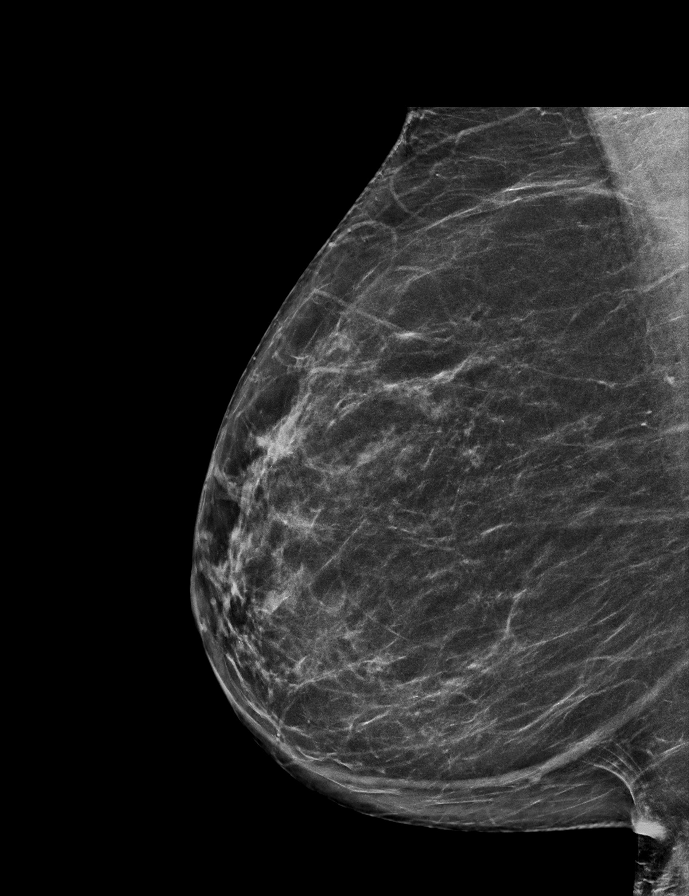

[L CC synth-2D]
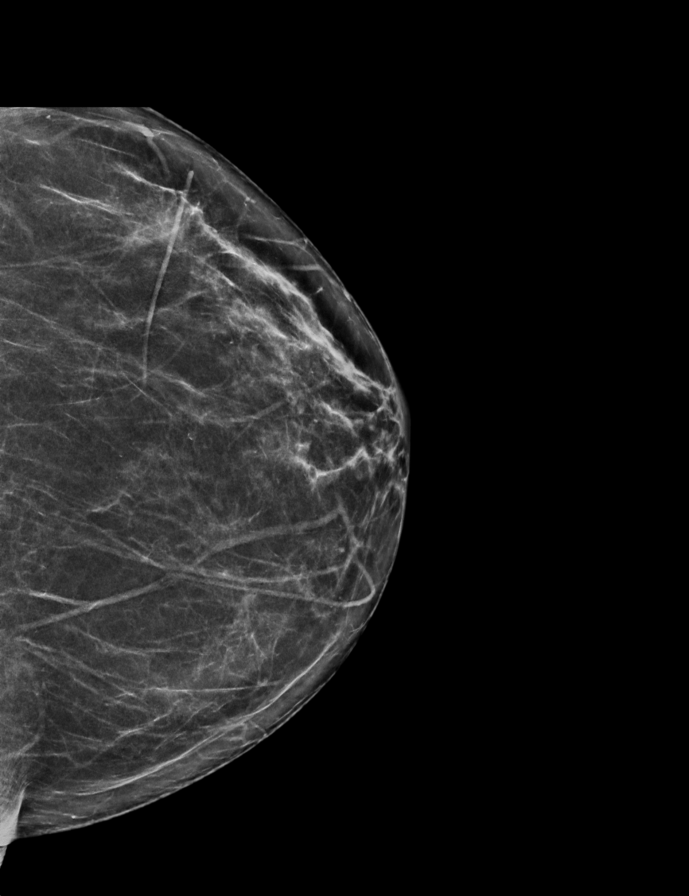

[R CC tomo · 2 of 67 frames shown]
[frame 22/67]
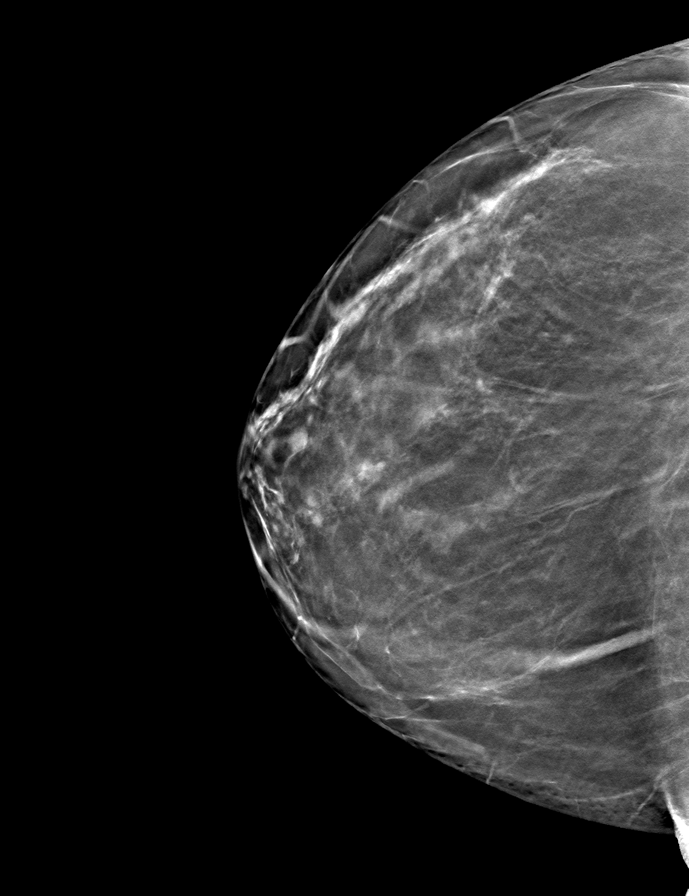
[frame 34/67]
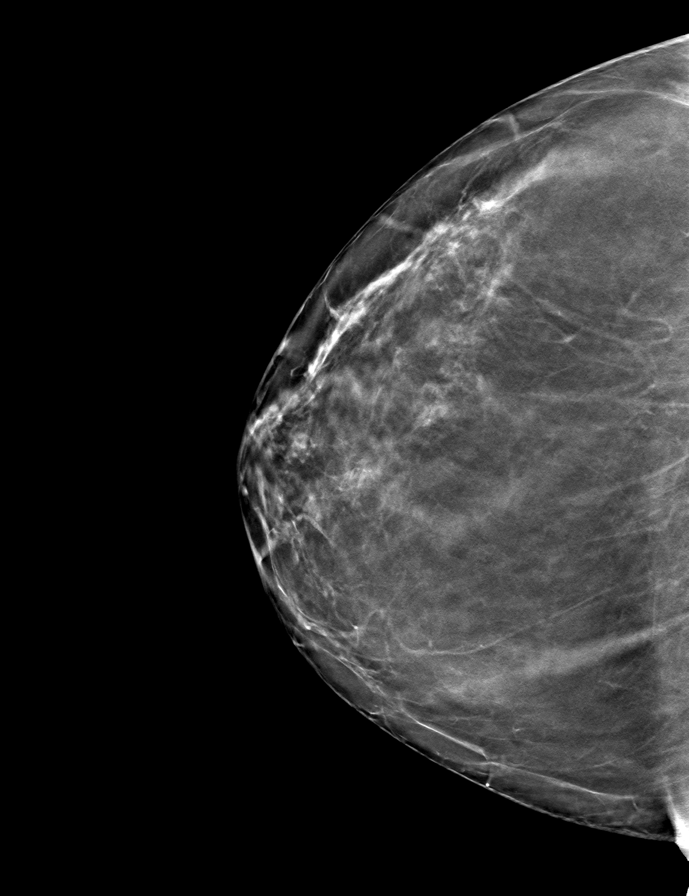

[L MLO tomo · tomo slice 35/69.0]
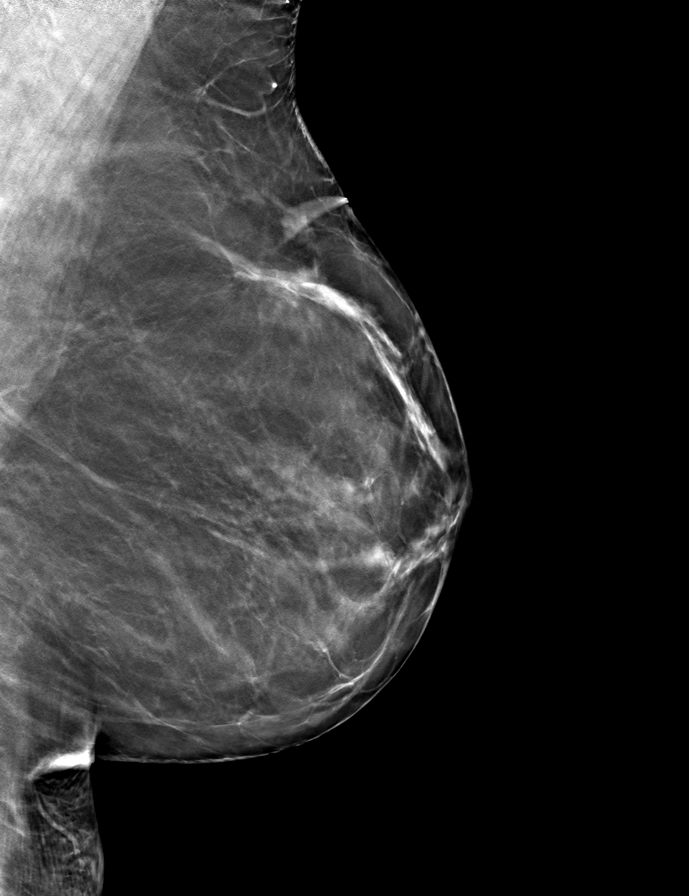

[L CC tomo · tomo slice 33/65.0]
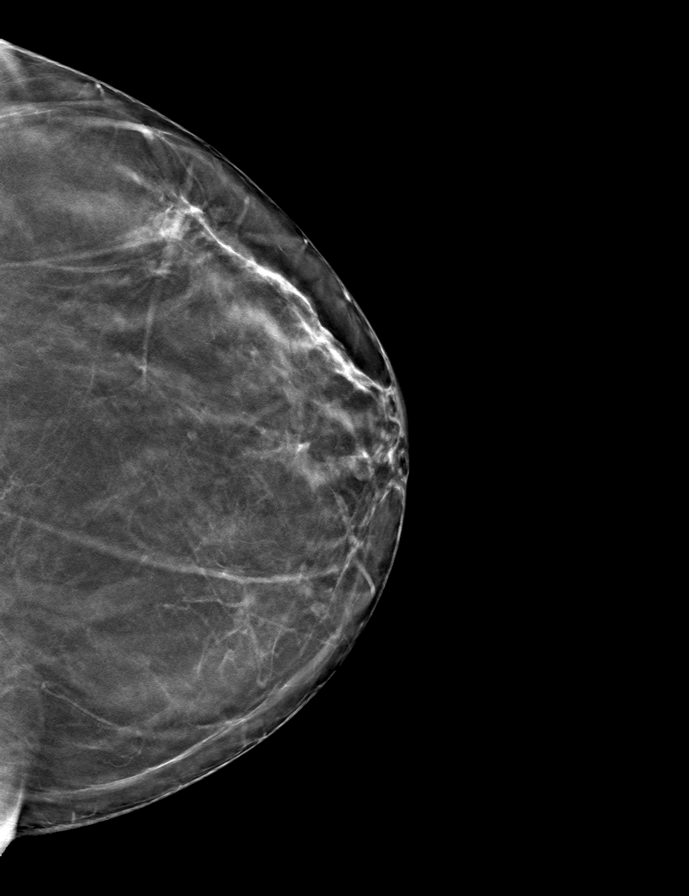

[R MLO tomo · tomo slice 35/69.0]
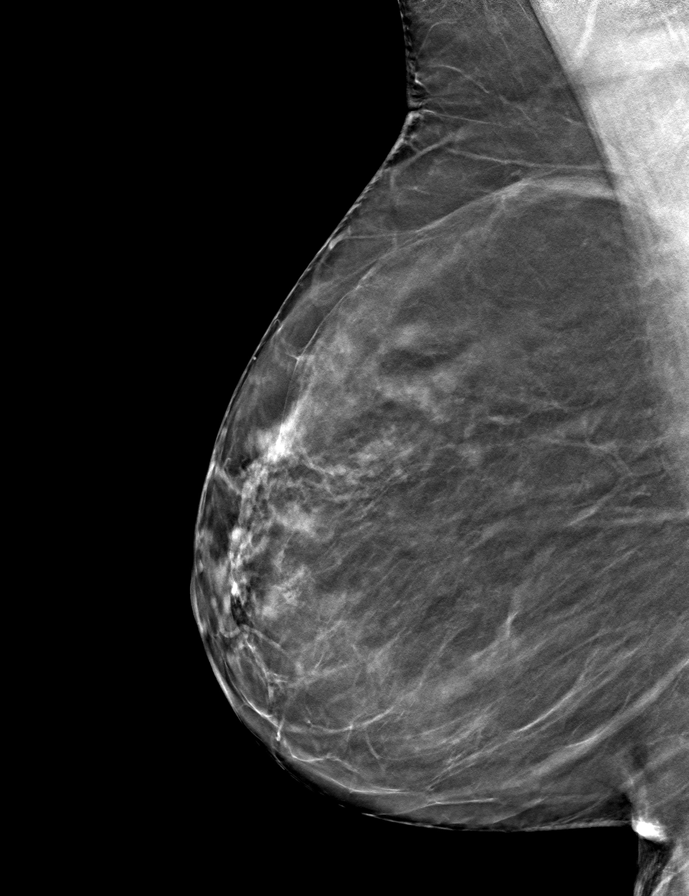

[9 of 24 positions shown; findings below may reference images not displayed]

ACR Breast Density Category b: There are scattered areas of
fibroglandular density.
FINDINGS: There are no findings suspicious for malignancy. Images were
processed with CAD.
IMPRESSION: No mammographic evidence of malignancy. A result letter of this
screening mammogram will be mailed directly to the patient.

RECOMMENDATION:
Screening mammogram in one year. (Code:[TQ])

BI-RADS CATEGORY  1: Negative.

## 2018-03-10 ENCOUNTER — Other Ambulatory Visit: Payer: Self-pay | Admitting: Endocrinology

## 2018-03-10 DIAGNOSIS — E049 Nontoxic goiter, unspecified: Secondary | ICD-10-CM

## 2018-03-27 DIAGNOSIS — E6609 Other obesity due to excess calories: Secondary | ICD-10-CM | POA: Diagnosis not present

## 2018-03-27 DIAGNOSIS — E049 Nontoxic goiter, unspecified: Secondary | ICD-10-CM | POA: Diagnosis not present

## 2018-03-27 DIAGNOSIS — R7301 Impaired fasting glucose: Secondary | ICD-10-CM | POA: Diagnosis not present

## 2018-03-27 DIAGNOSIS — E890 Postprocedural hypothyroidism: Secondary | ICD-10-CM | POA: Diagnosis not present

## 2018-03-27 DIAGNOSIS — R635 Abnormal weight gain: Secondary | ICD-10-CM | POA: Diagnosis not present

## 2018-03-31 ENCOUNTER — Ambulatory Visit
Admission: RE | Admit: 2018-03-31 | Discharge: 2018-03-31 | Disposition: A | Discharge status: Home / Self Care | Payer: PPO | Source: Ambulatory Visit | Attending: Endocrinology | Admitting: Endocrinology

## 2018-03-31 DIAGNOSIS — E049 Nontoxic goiter, unspecified: Secondary | ICD-10-CM

## 2018-03-31 DIAGNOSIS — R7301 Impaired fasting glucose: Secondary | ICD-10-CM | POA: Diagnosis not present

## 2018-03-31 DIAGNOSIS — E042 Nontoxic multinodular goiter: Secondary | ICD-10-CM | POA: Diagnosis not present

## 2018-03-31 DIAGNOSIS — E890 Postprocedural hypothyroidism: Secondary | ICD-10-CM | POA: Diagnosis not present

## 2018-05-15 DIAGNOSIS — J3089 Other allergic rhinitis: Secondary | ICD-10-CM | POA: Diagnosis not present

## 2018-05-15 DIAGNOSIS — H1045 Other chronic allergic conjunctivitis: Secondary | ICD-10-CM | POA: Diagnosis not present

## 2018-05-15 DIAGNOSIS — R05 Cough: Secondary | ICD-10-CM | POA: Diagnosis not present

## 2018-05-15 DIAGNOSIS — J301 Allergic rhinitis due to pollen: Secondary | ICD-10-CM | POA: Diagnosis not present

## 2018-09-29 DIAGNOSIS — E890 Postprocedural hypothyroidism: Secondary | ICD-10-CM | POA: Diagnosis not present

## 2018-09-29 DIAGNOSIS — R7301 Impaired fasting glucose: Secondary | ICD-10-CM | POA: Diagnosis not present

## 2018-10-02 DIAGNOSIS — E049 Nontoxic goiter, unspecified: Secondary | ICD-10-CM | POA: Diagnosis not present

## 2018-10-02 DIAGNOSIS — E890 Postprocedural hypothyroidism: Secondary | ICD-10-CM | POA: Diagnosis not present

## 2018-10-02 DIAGNOSIS — E6609 Other obesity due to excess calories: Secondary | ICD-10-CM | POA: Diagnosis not present

## 2018-10-02 DIAGNOSIS — E7800 Pure hypercholesterolemia, unspecified: Secondary | ICD-10-CM | POA: Diagnosis not present

## 2018-10-02 DIAGNOSIS — R7301 Impaired fasting glucose: Secondary | ICD-10-CM | POA: Diagnosis not present

## 2018-10-02 DIAGNOSIS — R635 Abnormal weight gain: Secondary | ICD-10-CM | POA: Diagnosis not present

## 2019-03-10 ENCOUNTER — Other Ambulatory Visit: Payer: Self-pay | Admitting: Internal Medicine

## 2019-03-10 DIAGNOSIS — Z1231 Encounter for screening mammogram for malignant neoplasm of breast: Secondary | ICD-10-CM

## 2019-03-20 DIAGNOSIS — E890 Postprocedural hypothyroidism: Secondary | ICD-10-CM | POA: Diagnosis not present

## 2019-03-20 DIAGNOSIS — R7301 Impaired fasting glucose: Secondary | ICD-10-CM | POA: Diagnosis not present

## 2019-03-30 DIAGNOSIS — E6609 Other obesity due to excess calories: Secondary | ICD-10-CM | POA: Diagnosis not present

## 2019-03-30 DIAGNOSIS — R635 Abnormal weight gain: Secondary | ICD-10-CM | POA: Diagnosis not present

## 2019-03-30 DIAGNOSIS — E7800 Pure hypercholesterolemia, unspecified: Secondary | ICD-10-CM | POA: Diagnosis not present

## 2019-03-30 DIAGNOSIS — E890 Postprocedural hypothyroidism: Secondary | ICD-10-CM | POA: Diagnosis not present

## 2019-03-30 DIAGNOSIS — R7301 Impaired fasting glucose: Secondary | ICD-10-CM | POA: Diagnosis not present

## 2019-03-30 DIAGNOSIS — E049 Nontoxic goiter, unspecified: Secondary | ICD-10-CM | POA: Diagnosis not present

## 2019-04-23 ENCOUNTER — Other Ambulatory Visit: Payer: Self-pay

## 2019-04-23 ENCOUNTER — Ambulatory Visit
Admission: RE | Admit: 2019-04-23 | Discharge: 2019-04-23 | Disposition: A | Discharge status: Home / Self Care | Payer: PPO | Source: Ambulatory Visit | Attending: Internal Medicine | Admitting: Internal Medicine

## 2019-04-23 DIAGNOSIS — Z1231 Encounter for screening mammogram for malignant neoplasm of breast: Secondary | ICD-10-CM | POA: Diagnosis not present

## 2019-04-23 IMAGING — MG MM DIGITAL SCREENING BILAT W/ TOMO W/ CAD
8 series · 8 of 24 positions shown · non-contrast
Comparison: Previous exam(s).

CLINICAL DATA: Screening.

EXAM:
DIGITAL SCREENING BILATERAL MAMMOGRAM WITH TOMO AND CAD

[L CC synth-2D]
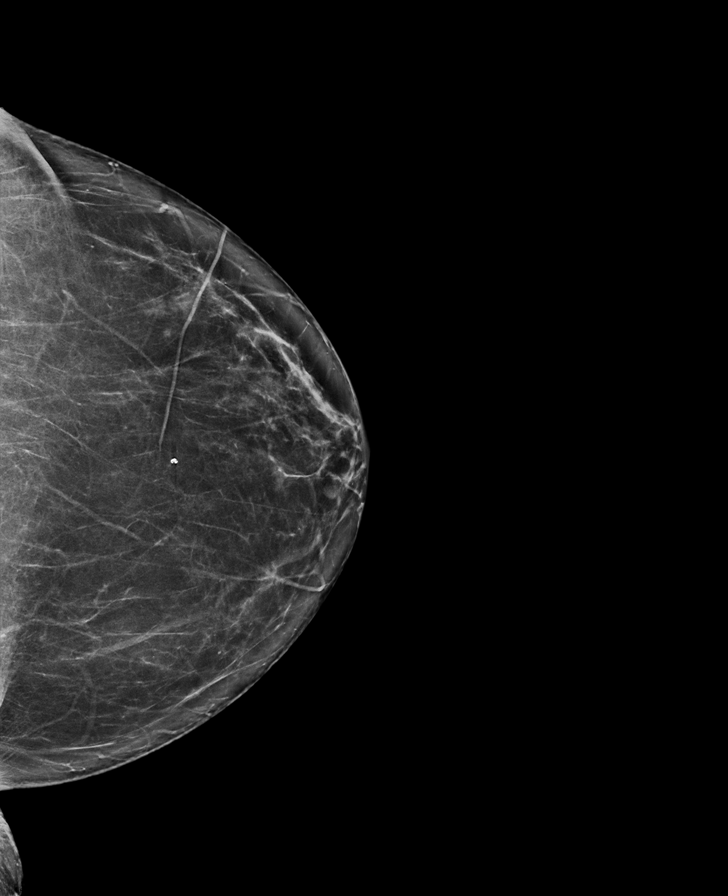

[R CC synth-2D]
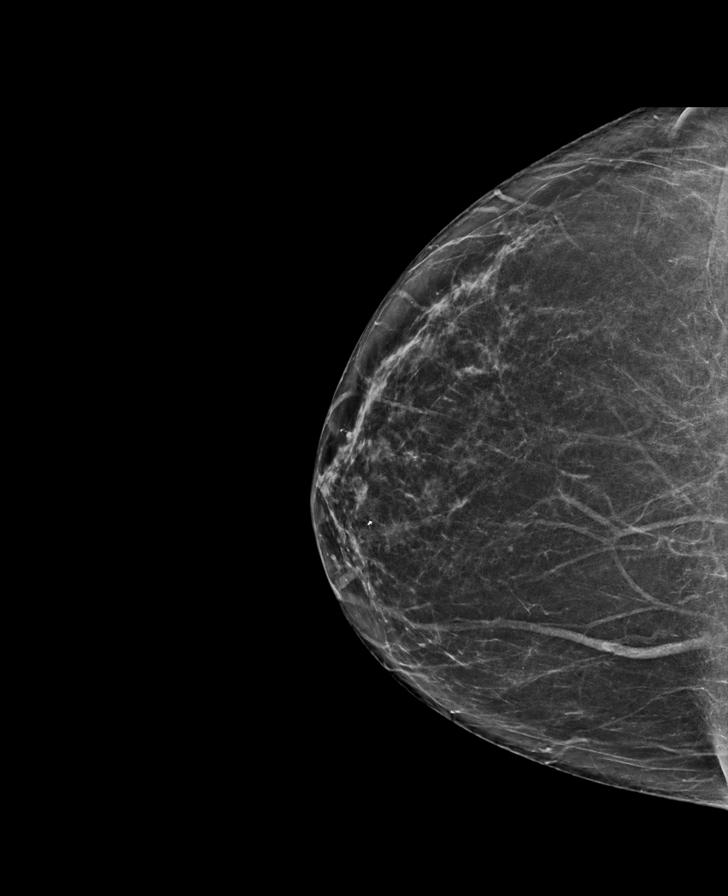

[L MLO synth-2D]
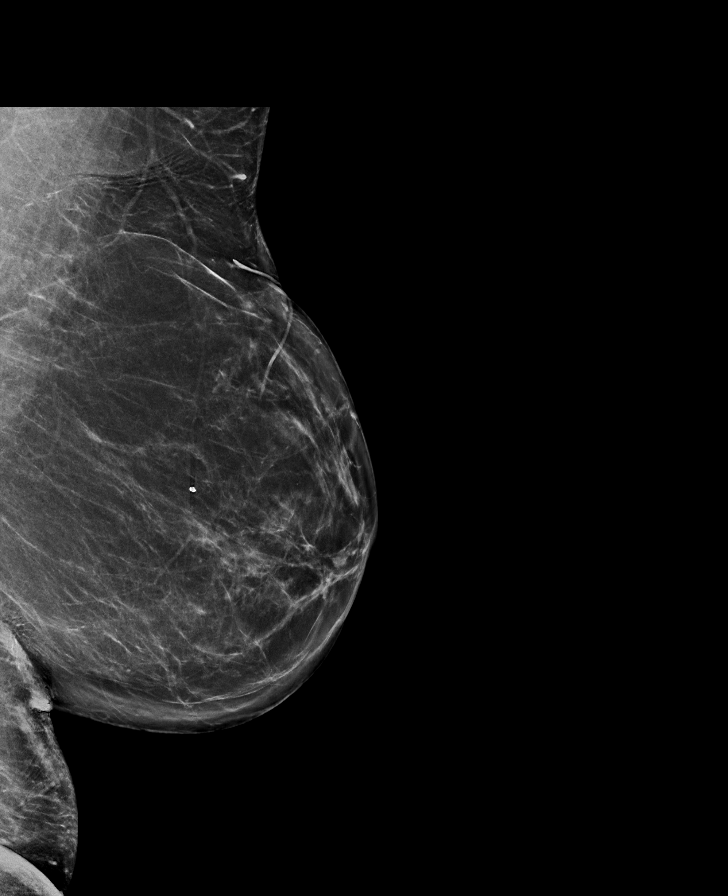

[R MLO synth-2D]
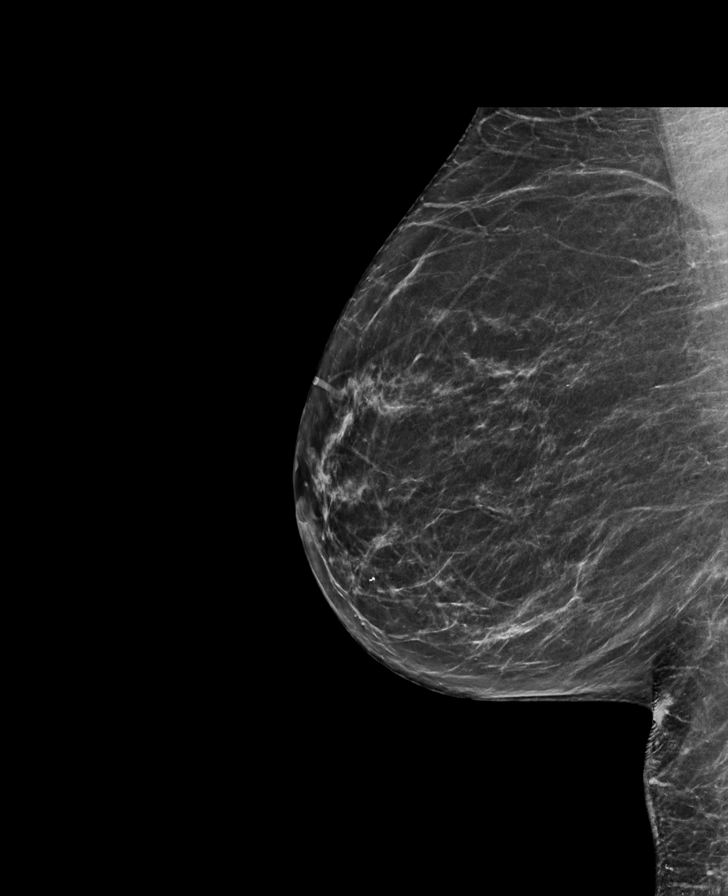

[R MLO tomo · tomo slice 37/73.0]
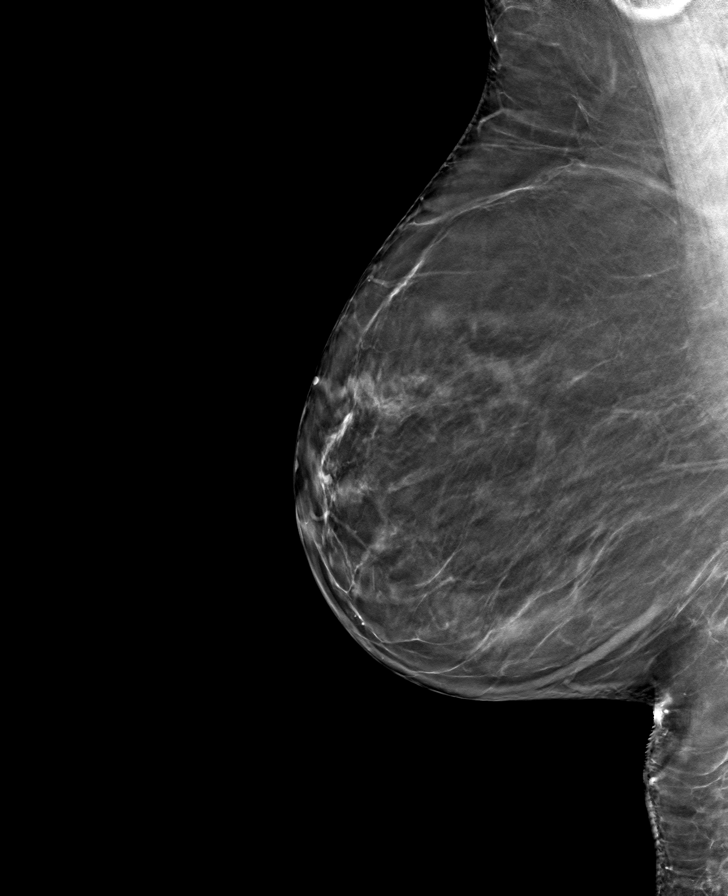

[L MLO tomo · tomo slice 39/78.0]
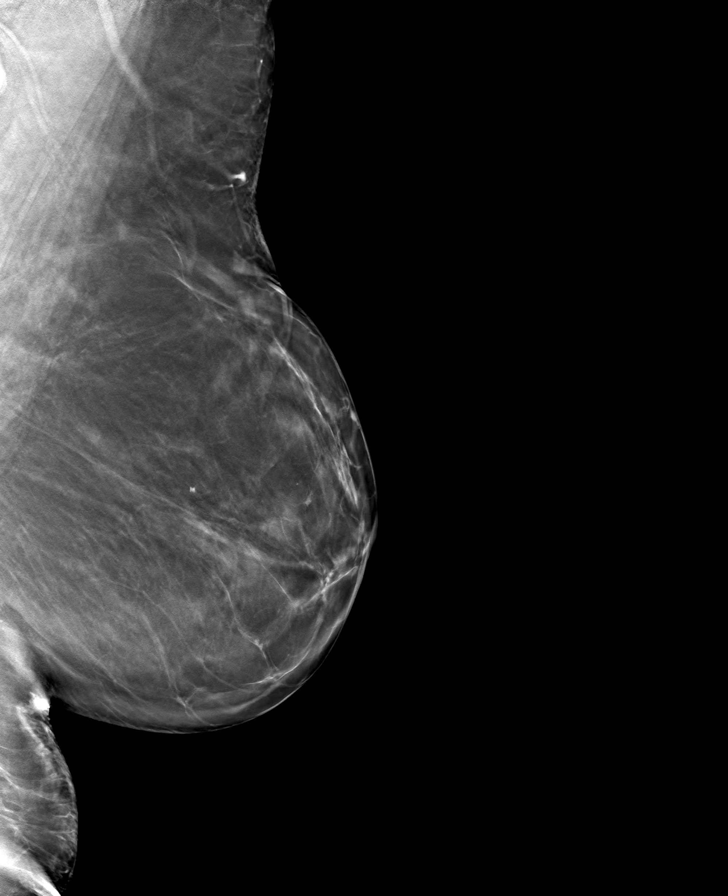

[R CC tomo · tomo slice 35/69.0]
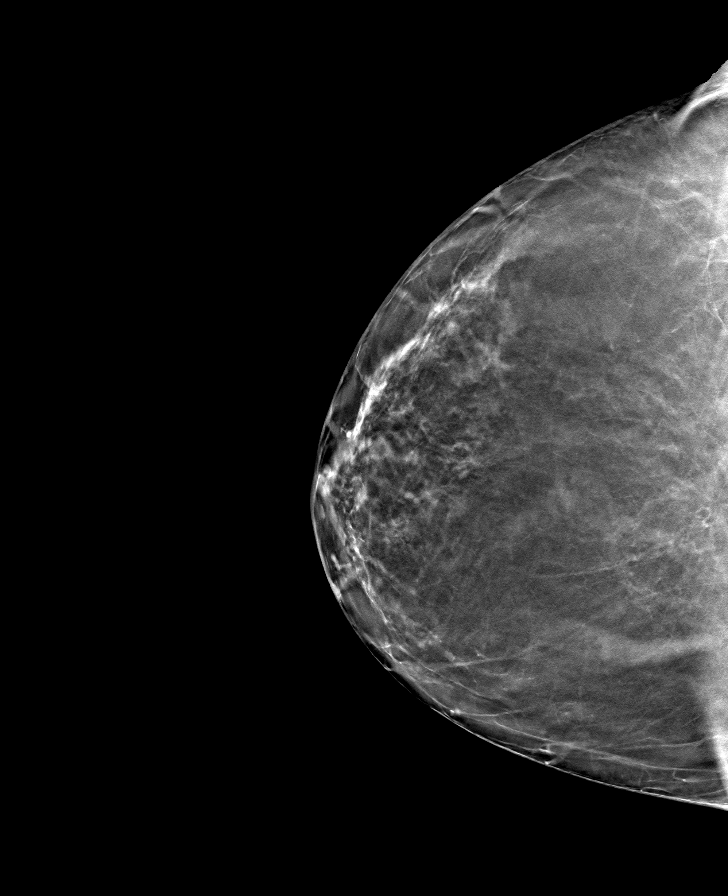

[L CC tomo · tomo slice 37/73.0]
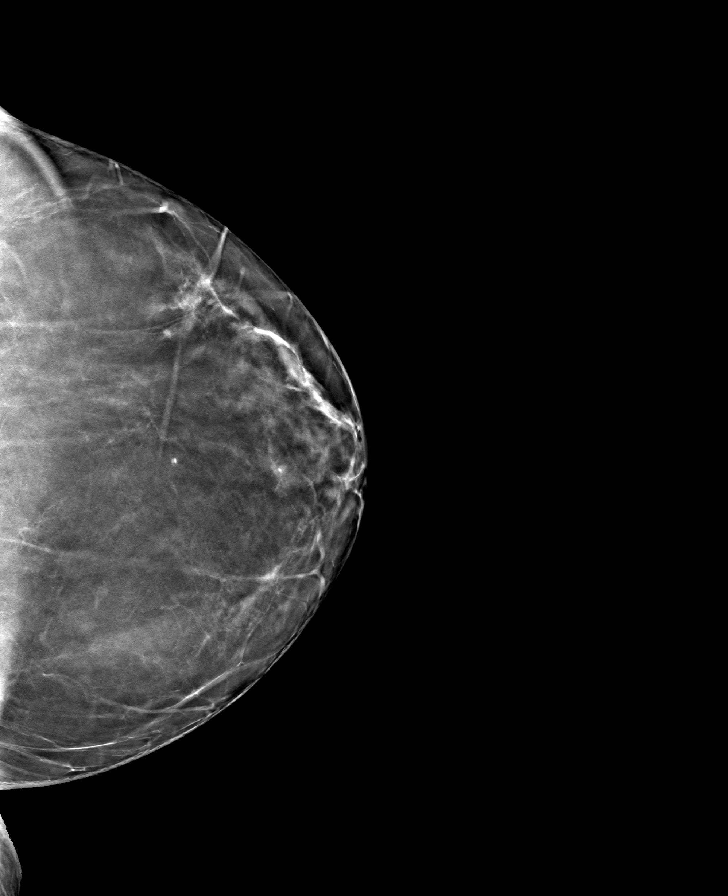

[8 of 24 positions shown; findings below may reference images not displayed]

ACR Breast Density Category b: There are scattered areas of
fibroglandular density.
FINDINGS: There are no findings suspicious for malignancy. Images were
processed with CAD.
IMPRESSION: No mammographic evidence of malignancy. A result letter of this
screening mammogram will be mailed directly to the patient.

RECOMMENDATION:
Screening mammogram in one year. (Code:[TQ])

BI-RADS CATEGORY  1: Negative.

## 2019-07-18 DIAGNOSIS — N390 Urinary tract infection, site not specified: Secondary | ICD-10-CM | POA: Diagnosis not present

## 2019-07-28 ENCOUNTER — Ambulatory Visit: Payer: PPO | Attending: Internal Medicine

## 2019-07-28 DIAGNOSIS — Z20822 Contact with and (suspected) exposure to covid-19: Secondary | ICD-10-CM

## 2019-07-28 DIAGNOSIS — Z20828 Contact with and (suspected) exposure to other viral communicable diseases: Secondary | ICD-10-CM | POA: Diagnosis not present

## 2019-07-30 LAB — NOVEL CORONAVIRUS, NAA: SARS-CoV-2, NAA: NOT DETECTED

## 2019-08-13 DIAGNOSIS — R319 Hematuria, unspecified: Secondary | ICD-10-CM | POA: Diagnosis not present

## 2019-08-13 DIAGNOSIS — R399 Unspecified symptoms and signs involving the genitourinary system: Secondary | ICD-10-CM | POA: Diagnosis not present

## 2019-08-21 ENCOUNTER — Other Ambulatory Visit: Payer: Self-pay | Admitting: Internal Medicine

## 2019-08-21 DIAGNOSIS — R3129 Other microscopic hematuria: Secondary | ICD-10-CM

## 2019-09-01 ENCOUNTER — Ambulatory Visit: Payer: PPO

## 2019-09-02 ENCOUNTER — Ambulatory Visit
Admission: RE | Admit: 2019-09-02 | Discharge: 2019-09-02 | Disposition: A | Discharge status: Home / Self Care | Payer: PPO | Source: Ambulatory Visit | Attending: Internal Medicine | Admitting: Internal Medicine

## 2019-09-02 ENCOUNTER — Other Ambulatory Visit: Payer: Self-pay

## 2019-09-02 DIAGNOSIS — N200 Calculus of kidney: Secondary | ICD-10-CM | POA: Diagnosis not present

## 2019-09-02 DIAGNOSIS — R3129 Other microscopic hematuria: Secondary | ICD-10-CM | POA: Diagnosis not present

## 2019-09-02 IMAGING — CT CT RENAL STONE PROTOCOL
2 of 4 series · 15 of 46 positions shown, 17 images · non-contrast
Comparison: None

CLINICAL DATA: Hematuria 1 month prior

EXAM:
CT ABDOMEN AND PELVIS WITHOUT CONTRAST
TECHNIQUE: Multidetector CT imaging of the abdomen and pelvis was performed
following the standard protocol without IV contrast.

[Series 2: renal stone 5.00 · axial · 0.79mm/px · z∈[-1621,-1216]mm · 12 of 97 slices shown, 14 images]
[im 8/97  soft-tissue]
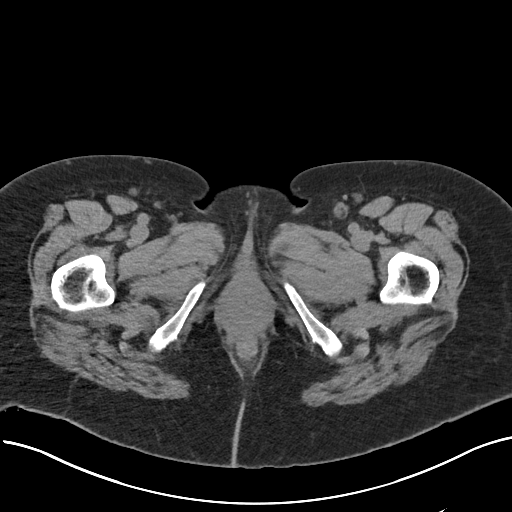
[im 8/97  bone]
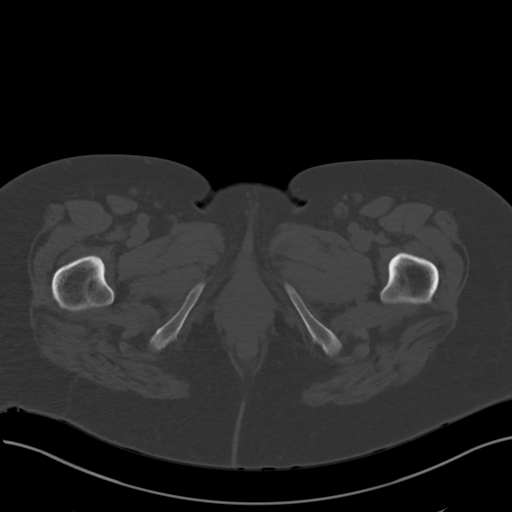
[im 15/97  soft-tissue]
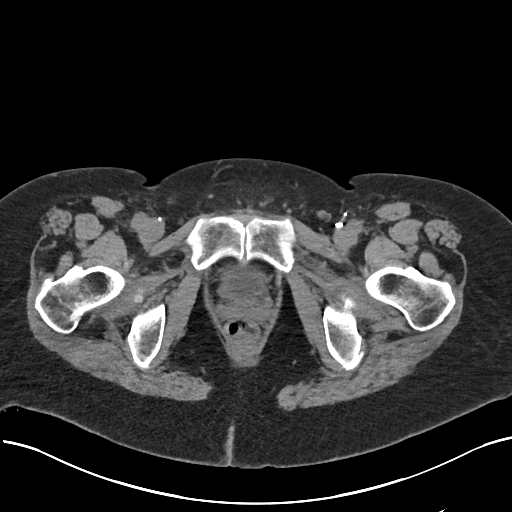
[im 23/97  soft-tissue]
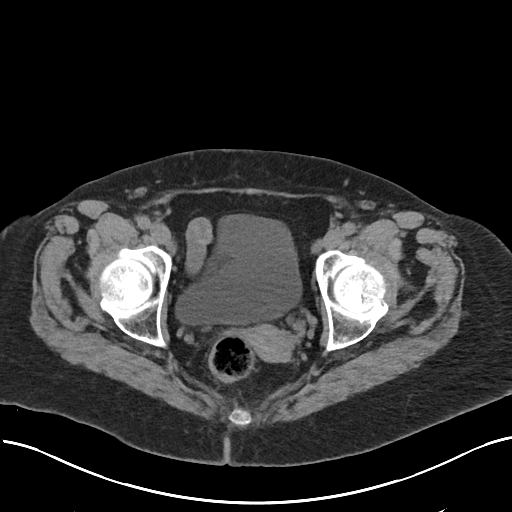
[im 30/97  soft-tissue]
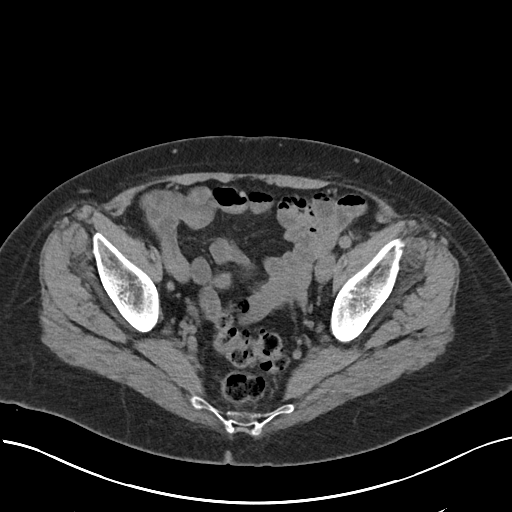
[im 37/97  soft-tissue]
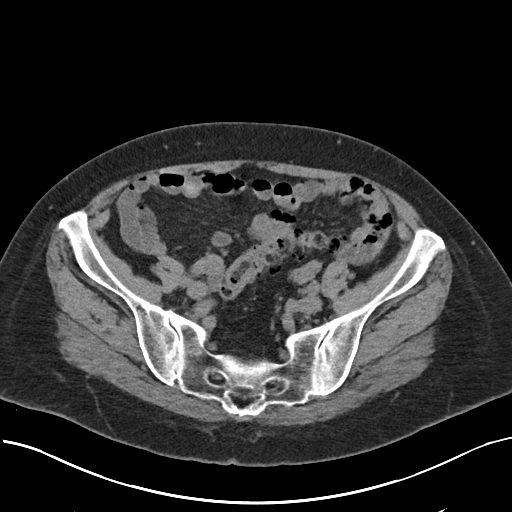
[im 45/97  soft-tissue]
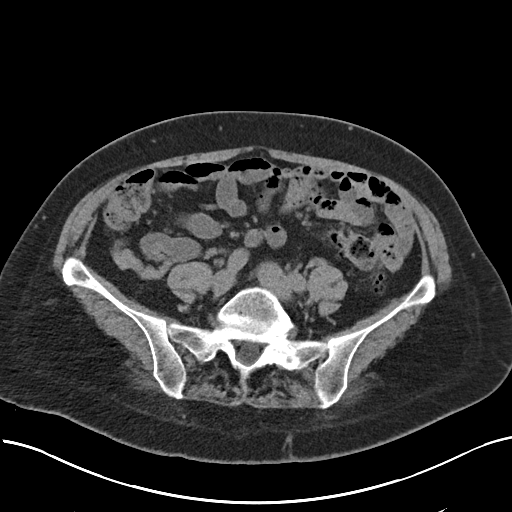
[im 52/97  soft-tissue]
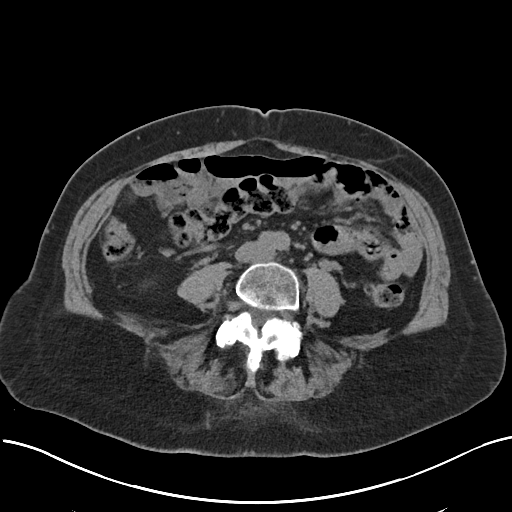
[im 60/97  soft-tissue]
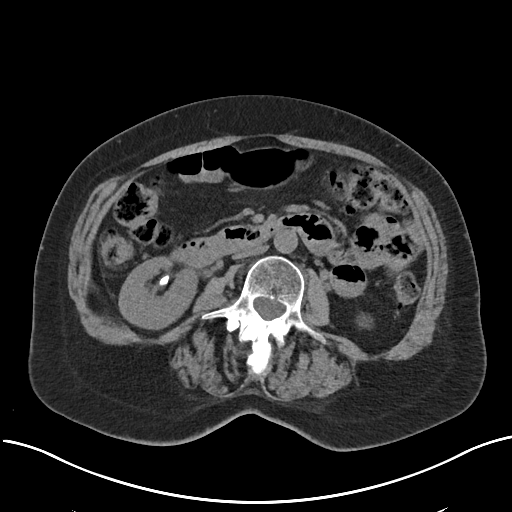
[im 67/97  soft-tissue]
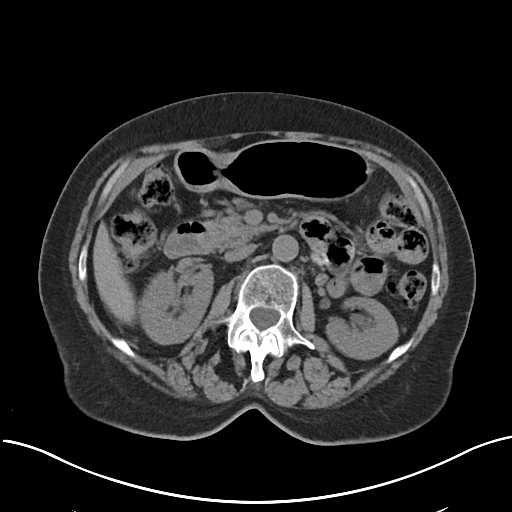
[im 67/97  bone]
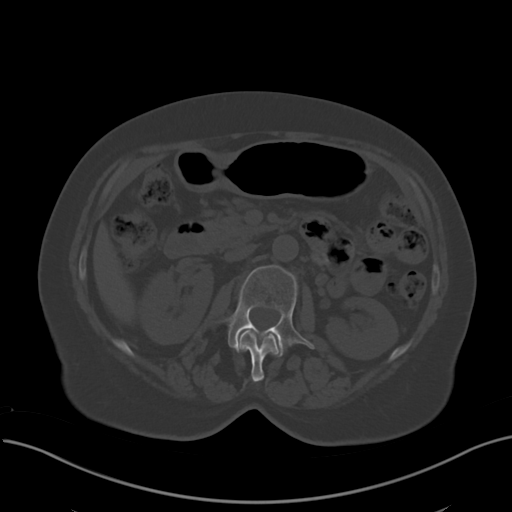
[im 74/97  soft-tissue]
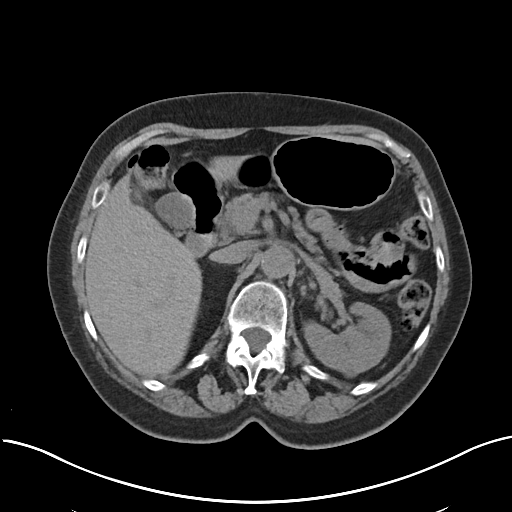
[im 82/97  soft-tissue]
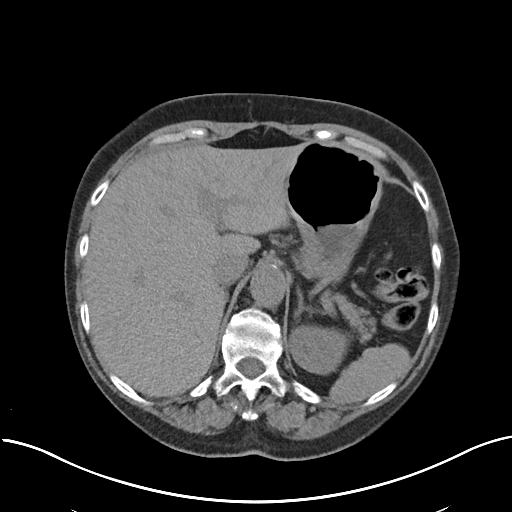
[im 89/97  soft-tissue]
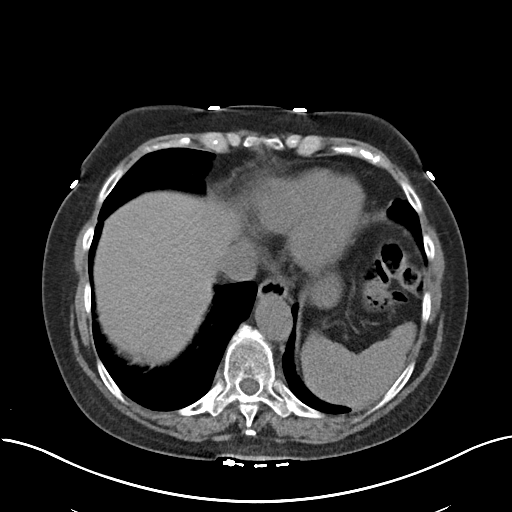

[Series 4: renal stone 2.00 cor · coronal · 0.79mm/px · 3 of 151 slices shown]
[im 51/151  soft-tissue]
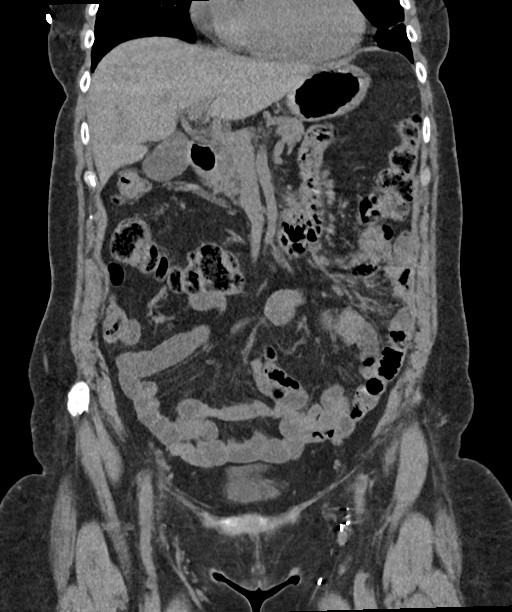
[im 67/151  soft-tissue]
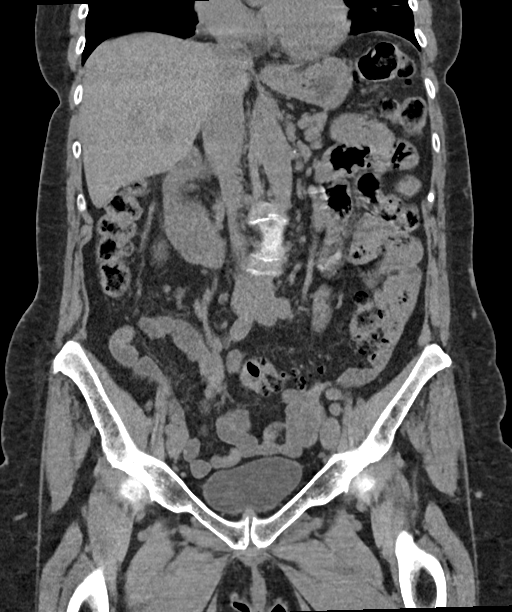
[im 84/151  soft-tissue]
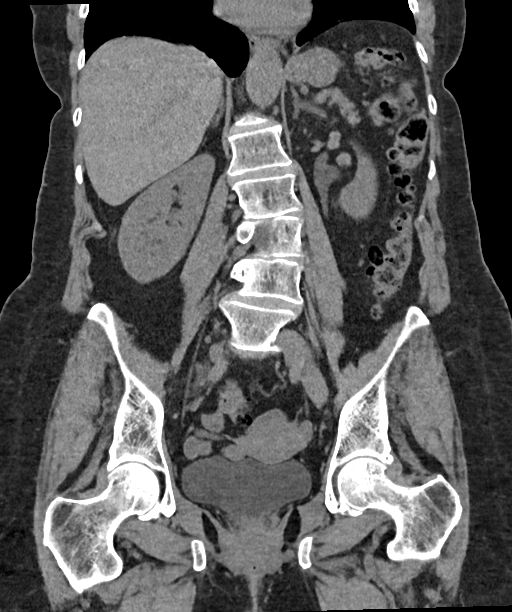

[15 of 46 positions shown; findings below may reference images not displayed]

FINDINGS: Lower chest: Bandlike areas of opacity in the lung bases likely
reflect subsegmental atelectasis. No consolidation or effusion.
Normal heart size. No pericardial effusion.

Hepatobiliary: Subcentimeter hypoattenuating focus in the anterior
liver ([DATE]) is too small fully characterize though statistically
likely benign. No worrisome hepatic lesions. Smooth liver surface
contour. Normal gallbladder. No visible calcified gallstones or
biliary ductal dilatation.

Pancreas: Unremarkable. No pancreatic ductal dilatation or
surrounding inflammatory changes.

Spleen: Few subcentimeter foci in the spleen are too small to fully
characterize on CT imaging but statistically likely benign. Normal
splenic size. No concerning splenic lesions.

Adrenals/Urinary Tract: Normal adrenal glands. There is a 1.1 cm
thick-walled cystic structure posterior interpolar left kidney
incompletely characterized on this single-phase exam ([DATE]). No
other worrisome renal lesions. Bilateral nephrolithiasis is seen.
There is slight asymmetric right renal fullness with faint stranding
around the renal pelvis and proximal right ureter. No gross
hydroureteronephrosis is seen. No visible distal ureteral calculus.
No left urinary tract dilatation. Urinary bladder is unremarkable.

Stomach/Bowel: Distal esophagus, stomach and duodenal sweep are
unremarkable. No small bowel wall thickening or dilatation. No
evidence of obstruction. A normal appendix is visualized. No colonic
dilatation or wall thickening. Scattered colonic diverticula without
focal pericolonic inflammation to suggest diverticulitis.

Vascular/Lymphatic: Atherosclerotic plaque within the normal caliber
aorta. No suspicious or enlarged lymph nodes in the included
lymphatic chains. Extensive surgical clips are noted in both
inguinal regions, correlate for history of vascular access.

Reproductive: Anteverted uterus. No concerning adnexal lesions.

Other: No free fluid. No free air. No bowel containing hernias.
Small fat containing umbilical hernia.

Musculoskeletal: Multilevel degenerative changes are present in the
imaged portions of the spine. Levocurvature of the lumbar spine with
apex at L3. Interspinous arthrosis throughout the lumbar spine is
compatible with Baastrup's disease. Additional degenerative changes
in both hips. No acute osseous abnormality or suspicious osseous
lesion.
IMPRESSION: 1. Bilateral nephrolithiasis with asymmetric right renal fullness
with faint parapelvic and proximal right ureteral stranding. No
obstructive urolithiasis. This finding is nonspecific and could be
seen with a recently passed stone or infection. Recommend
correlation with urinalysis.
2. 1.1 cm thick-walled cystic structure posterior interpolar left
kidney is incompletely characterized on this single-phase exam.
Dedicated outpatient, nonemergent contrast enhanced renal MR is
recommended based on the size of this finding. This recommendation
follows ACR consensus guidelines: Management of the Incidental Renal
Mass on CT: A White Paper of the ACR Incidental Findings Committee.
[HOSPITAL] [9F];[DATE].
3. Colonic diverticulosis without evidence of diverticulitis.
4. Multilevel degenerative changes in the imaged portions of the
spine and both hips. Interspinous arthrosis throughout the lumbar
spine is compatible with Baastrup's disease.

## 2019-09-04 ENCOUNTER — Other Ambulatory Visit: Payer: Self-pay | Admitting: Internal Medicine

## 2019-09-04 DIAGNOSIS — R3121 Asymptomatic microscopic hematuria: Secondary | ICD-10-CM | POA: Insufficient documentation

## 2019-09-04 DIAGNOSIS — N281 Cyst of kidney, acquired: Secondary | ICD-10-CM

## 2019-09-04 DIAGNOSIS — M519 Unspecified thoracic, thoracolumbar and lumbosacral intervertebral disc disorder: Secondary | ICD-10-CM | POA: Insufficient documentation

## 2019-09-10 ENCOUNTER — Ambulatory Visit: Payer: PPO

## 2019-09-15 ENCOUNTER — Other Ambulatory Visit: Payer: Self-pay

## 2019-09-15 ENCOUNTER — Ambulatory Visit
Admission: RE | Admit: 2019-09-15 | Discharge: 2019-09-15 | Disposition: A | Discharge status: Home / Self Care | Payer: PPO | Source: Ambulatory Visit | Attending: Internal Medicine | Admitting: Internal Medicine

## 2019-09-15 DIAGNOSIS — N281 Cyst of kidney, acquired: Secondary | ICD-10-CM | POA: Diagnosis not present

## 2019-09-15 IMAGING — MR MR ABDOMEN WO/W CM
18 series · 48 of 48 positions shown · IV contrast (gadavist)
Comparison: CT [DATE]

CLINICAL DATA: Indeterminate LEFT renal lesion identified on CT
exam. MRI recommended further characterization.

EXAM:
MRI ABDOMEN WITHOUT AND WITH CONTRAST
TECHNIQUE: Multiplanar multisequence MR imaging of the abdomen was performed
both before and after the administration of intravenous contrast.
CONTRAST:  8mL GADAVIST GADOBUTROL 1 MMOL/ML IV SOLN

[Series 2: cor haste · coronal · 6.0mm · 1.19mm/px · 2 of 32 slices shown]
[im 1/32]
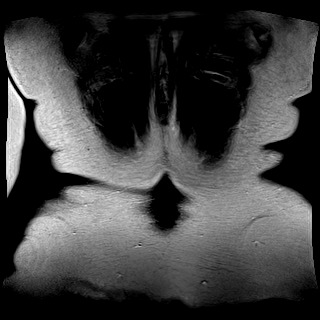
[im 32/32]
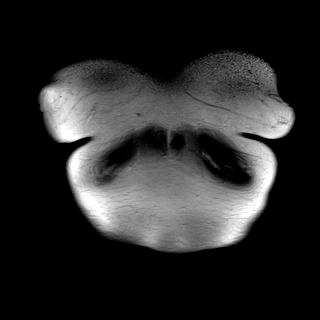

[Series 4: T2 fat-sat · axial · 6.0mm · 1.19mm/px · z∈[-112,+112]mm · 2 of 32 slices shown]
[im 1/32]
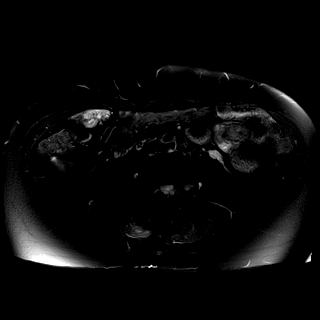
[im 32/32]
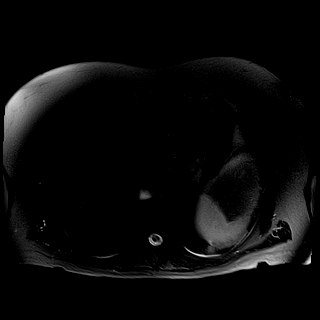

[Series 6: DWI · axial · 6.0mm · 1.42mm/px · z∈[-112,+112]mm · 5 of 96 slices shown (1 of 2)]
[im 1/96]
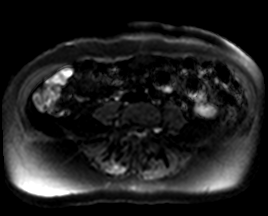
[im 24/96]
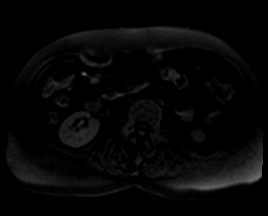
[im 48/96]
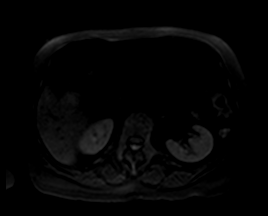
[im 72/96]
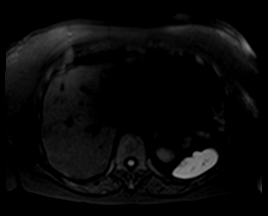
[im 96/96]
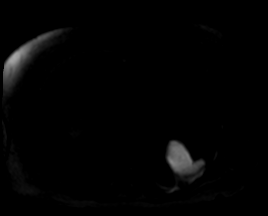

[Series 7: DWI · axial · 6.0mm · 1.42mm/px · 1 of 32 slices shown (2 of 2)]
[im 1/32]
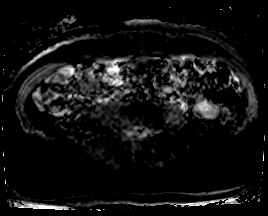

[Series 8: ax in & · axial · 3.0mm · 1.19mm/px · z∈[-106,+106]mm · 3 of 72 slices shown (1 of 2)]
[im 1/72]
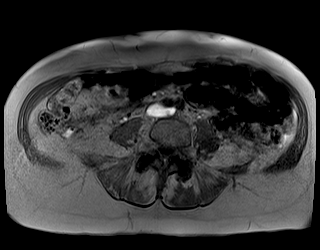
[im 36/72]
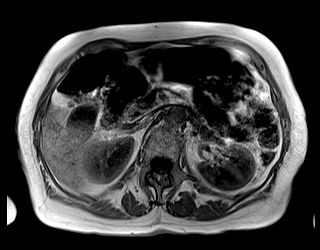
[im 72/72]
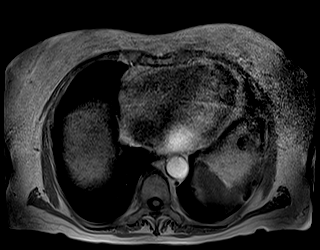

[Series 8: ax in & · axial · 3.0mm · 1.19mm/px · z∈[-106,+106]mm · 3 of 72 slices shown (2 of 2)]
[im 1/72]
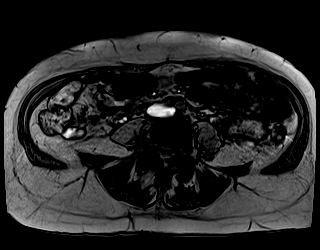
[im 36/72]
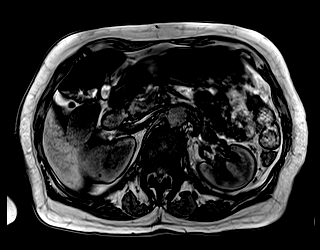
[im 72/72]
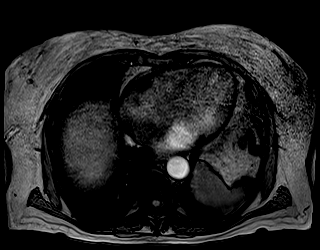

[Series 9: bSSFP · axial · 6.0mm · 0.74mm/px · 1 of 32 slices shown]
[im 1/32]
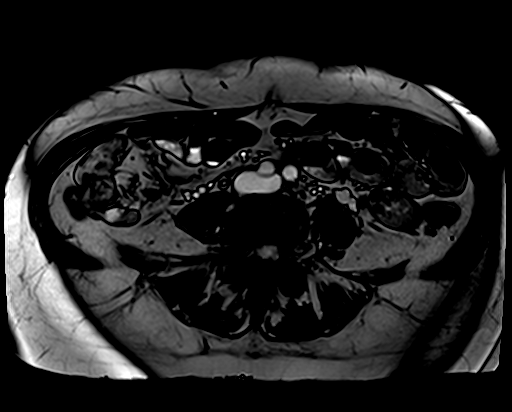

[Series 10: T1 dynamic · axial · non-contrast · 3.0mm · 1.19mm/px · z∈[-118,+118]mm · 3 of 80 slices shown (1 of 9)]
[im 1/80]
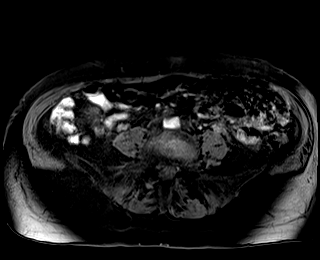
[im 40/80]
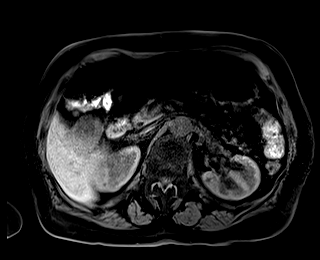
[im 80/80]
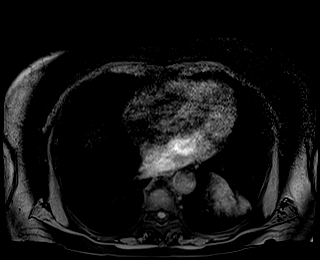

[Series 11: T1 dynamic · axial · 3.0mm · 1.19mm/px · z∈[-118,+118]mm · 3 of 80 slices shown (2 of 9)]
[im 1/80]
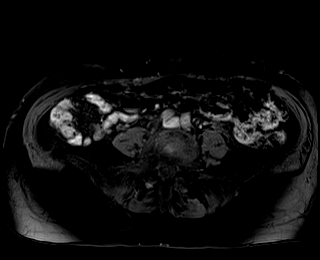
[im 40/80]
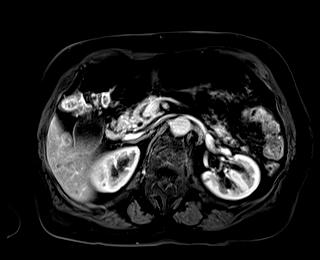
[im 80/80]
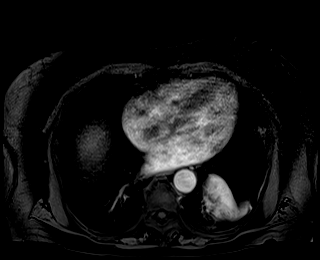

[Series 12: T1 dynamic · axial · 3.0mm · 1.19mm/px · z∈[-118,+118]mm · 3 of 80 slices shown (3 of 9)]
[im 1/80]
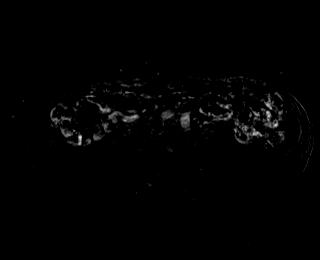
[im 40/80]
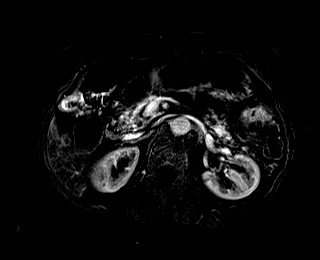
[im 80/80]
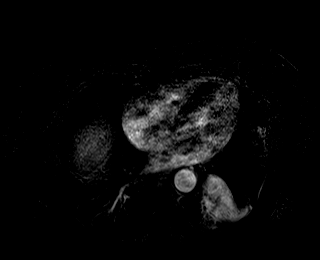

[Series 13: T1 dynamic · axial · 3.0mm · 1.19mm/px · z∈[-118,+118]mm · 3 of 80 slices shown (4 of 9)]
[im 1/80]
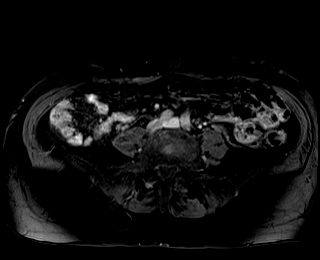
[im 40/80]
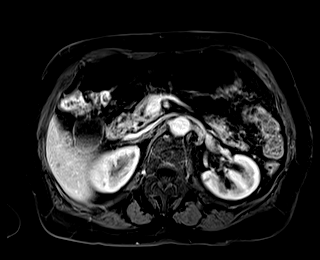
[im 80/80]
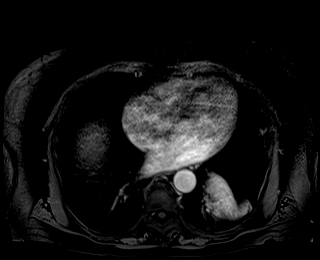

[Series 14: T1 dynamic · axial · 3.0mm · 1.19mm/px · z∈[-118,+118]mm · 3 of 80 slices shown (5 of 9)]
[im 1/80]
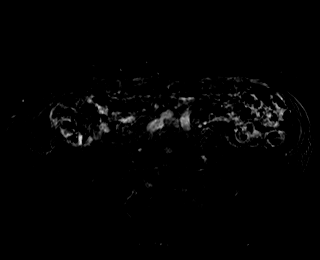
[im 40/80]
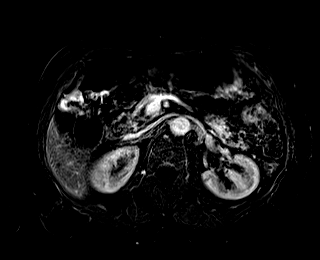
[im 80/80]
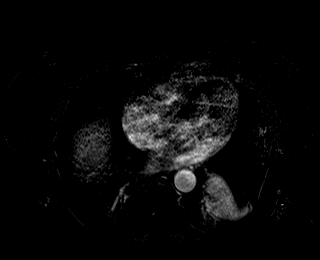

[Series 15: T1 dynamic · axial · 3.0mm · 1.19mm/px · z∈[-118,+118]mm · 3 of 80 slices shown (6 of 9)]
[im 1/80]
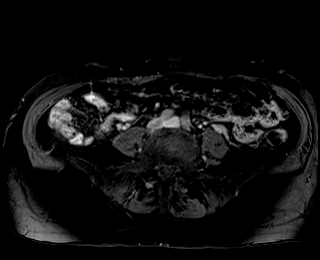
[im 40/80]
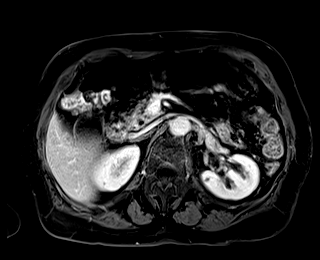
[im 80/80]
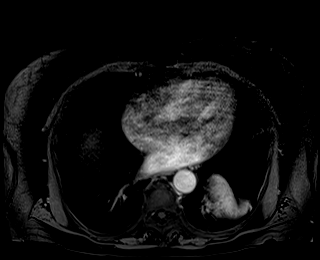

[Series 16: T1 dynamic · axial · 3.0mm · 1.19mm/px · z∈[-118,+118]mm · 3 of 80 slices shown (7 of 9)]
[im 1/80]
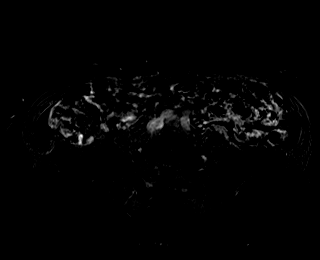
[im 40/80]
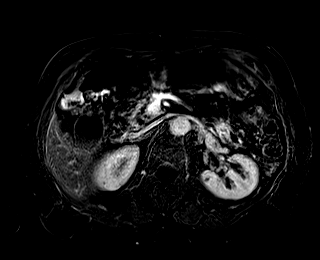
[im 80/80]
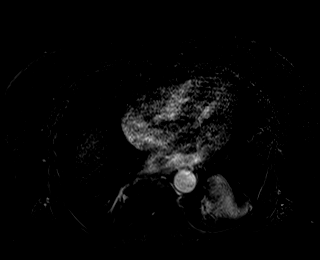

[Series 17: T1 dynamic post-contrast · coronal · 3.0mm · 1.31mm/px · 3 of 80 slices shown]
[im 1/80]
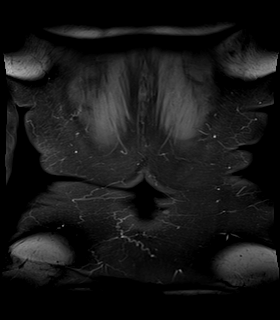
[im 40/80]
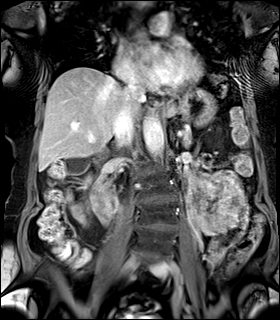
[im 80/80]
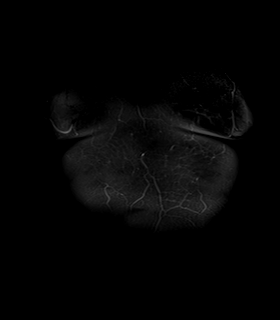

[Series 18: T2 · axial · 6.0mm · 1.19mm/px · 1 of 32 slices shown]
[im 1/32]
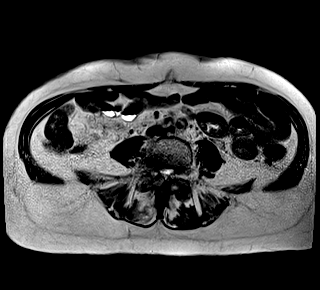

[Series 19: T1 dynamic · axial · 3.0mm · 1.19mm/px · z∈[-118,+118]mm · 3 of 80 slices shown (8 of 9)]
[im 1/80]
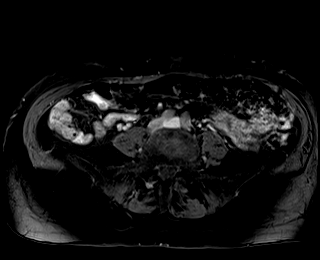
[im 40/80]
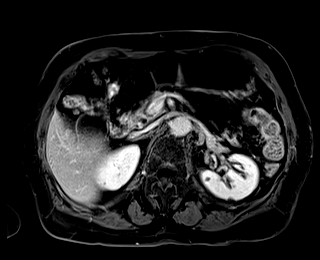
[im 80/80]
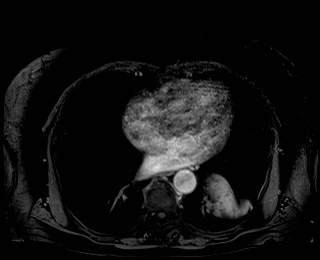

[Series 20: T1 dynamic · axial · 3.0mm · 1.19mm/px · z∈[-118,+118]mm · 3 of 80 slices shown (9 of 9)]
[im 1/80]
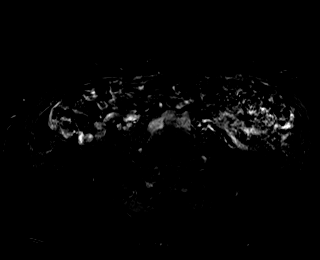
[im 40/80]
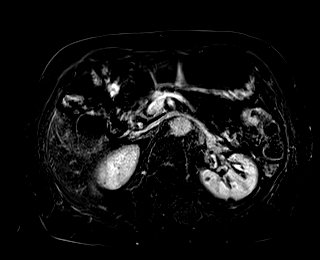
[im 80/80]
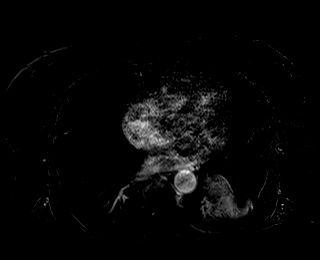

[48 of 48 positions shown; findings below may reference images not displayed]

FINDINGS: Lower chest:  Lung bases are clear.

Hepatobiliary: Several small benign cysts in the hepatic parenchyma.
Gallbladder normal. No biliary duct dilatation. Common bile duct.

Pancreas: Normal pancreatic parenchymal intensity. No ductal
dilatation or inflammation.

Spleen: Normal spleen.

Adrenals/urinary tract:

Within the posteromedial aspect of the LEFT kidney is a
well-circumscribed lesion measuring 9 mm (image 16/18). Lesion is
hyperintense on T2 weighted imaging and demonstrates no
post-contrast enhancement (image 37/11). No enhancing lesion within
LEFT or RIGHT kidney.

Stomach/Bowel: Stomach and limited of the small bowel is
unremarkable

Vascular/Lymphatic: Abdominal aortic normal caliber. No
retroperitoneal periportal lymphadenopathy.

Musculoskeletal: No aggressive osseous lesion
IMPRESSION: 1. Benign Bosniak I renal cyst of the LEFT kidney corresponds
indeterminate lesion on comparison CT.
2. Normal liver and pancreas.

## 2019-09-15 MED ORDER — GADOBUTROL 1 MMOL/ML IV SOLN
8.0000 mL | Freq: Once | INTRAVENOUS | Status: AC | PRN
  Administered 2019-09-15: 8 mL via INTRAVENOUS

## 2019-09-18 ENCOUNTER — Ambulatory Visit: Payer: PPO

## 2019-11-24 DIAGNOSIS — J301 Allergic rhinitis due to pollen: Secondary | ICD-10-CM | POA: Diagnosis not present

## 2019-11-24 DIAGNOSIS — J3089 Other allergic rhinitis: Secondary | ICD-10-CM | POA: Diagnosis not present

## 2019-11-24 DIAGNOSIS — R739 Hyperglycemia, unspecified: Secondary | ICD-10-CM | POA: Insufficient documentation

## 2019-11-24 DIAGNOSIS — H1045 Other chronic allergic conjunctivitis: Secondary | ICD-10-CM | POA: Diagnosis not present

## 2019-11-24 DIAGNOSIS — R05 Cough: Secondary | ICD-10-CM | POA: Diagnosis not present

## 2019-11-25 DIAGNOSIS — E034 Atrophy of thyroid (acquired): Secondary | ICD-10-CM | POA: Diagnosis not present

## 2019-11-25 DIAGNOSIS — R739 Hyperglycemia, unspecified: Secondary | ICD-10-CM | POA: Diagnosis not present

## 2019-11-25 DIAGNOSIS — E785 Hyperlipidemia, unspecified: Secondary | ICD-10-CM | POA: Diagnosis not present

## 2019-11-25 DIAGNOSIS — Z780 Asymptomatic menopausal state: Secondary | ICD-10-CM | POA: Diagnosis not present

## 2019-11-25 DIAGNOSIS — M519 Unspecified thoracic, thoracolumbar and lumbosacral intervertebral disc disorder: Secondary | ICD-10-CM | POA: Diagnosis not present

## 2019-11-25 DIAGNOSIS — Z1211 Encounter for screening for malignant neoplasm of colon: Secondary | ICD-10-CM | POA: Diagnosis not present

## 2019-11-25 DIAGNOSIS — M810 Age-related osteoporosis without current pathological fracture: Secondary | ICD-10-CM | POA: Diagnosis not present

## 2019-11-25 DIAGNOSIS — J309 Allergic rhinitis, unspecified: Secondary | ICD-10-CM | POA: Diagnosis not present

## 2019-11-25 DIAGNOSIS — R3121 Asymptomatic microscopic hematuria: Secondary | ICD-10-CM | POA: Diagnosis not present

## 2019-11-25 DIAGNOSIS — Z0000 Encounter for general adult medical examination without abnormal findings: Secondary | ICD-10-CM | POA: Diagnosis not present

## 2020-02-22 DIAGNOSIS — J309 Allergic rhinitis, unspecified: Secondary | ICD-10-CM | POA: Diagnosis not present

## 2020-02-22 DIAGNOSIS — Z01812 Encounter for preprocedural laboratory examination: Secondary | ICD-10-CM | POA: Diagnosis not present

## 2020-02-22 DIAGNOSIS — Z1211 Encounter for screening for malignant neoplasm of colon: Secondary | ICD-10-CM | POA: Diagnosis not present

## 2020-03-23 ENCOUNTER — Emergency Department
Admission: EM | Admit: 2020-03-23 | Discharge: 2020-03-23 | Disposition: A | Discharge status: Home / Self Care | Payer: PPO | Attending: Emergency Medicine | Admitting: Emergency Medicine

## 2020-03-23 ENCOUNTER — Other Ambulatory Visit: Payer: Self-pay

## 2020-03-23 ENCOUNTER — Emergency Department: Payer: PPO

## 2020-03-23 DIAGNOSIS — Y929 Unspecified place or not applicable: Secondary | ICD-10-CM | POA: Diagnosis not present

## 2020-03-23 DIAGNOSIS — Z87891 Personal history of nicotine dependence: Secondary | ICD-10-CM | POA: Insufficient documentation

## 2020-03-23 DIAGNOSIS — W010XXA Fall on same level from slipping, tripping and stumbling without subsequent striking against object, initial encounter: Secondary | ICD-10-CM | POA: Insufficient documentation

## 2020-03-23 DIAGNOSIS — Z7982 Long term (current) use of aspirin: Secondary | ICD-10-CM | POA: Insufficient documentation

## 2020-03-23 DIAGNOSIS — S42251A Displaced fracture of greater tuberosity of right humerus, initial encounter for closed fracture: Secondary | ICD-10-CM | POA: Diagnosis not present

## 2020-03-23 DIAGNOSIS — Y999 Unspecified external cause status: Secondary | ICD-10-CM | POA: Diagnosis not present

## 2020-03-23 DIAGNOSIS — Y939 Activity, unspecified: Secondary | ICD-10-CM | POA: Diagnosis not present

## 2020-03-23 DIAGNOSIS — S4991XA Unspecified injury of right shoulder and upper arm, initial encounter: Secondary | ICD-10-CM | POA: Diagnosis present

## 2020-03-23 DIAGNOSIS — S42201A Unspecified fracture of upper end of right humerus, initial encounter for closed fracture: Secondary | ICD-10-CM | POA: Diagnosis not present

## 2020-03-23 DIAGNOSIS — W19XXXA Unspecified fall, initial encounter: Secondary | ICD-10-CM

## 2020-03-23 DIAGNOSIS — S42252A Displaced fracture of greater tuberosity of left humerus, initial encounter for closed fracture: Secondary | ICD-10-CM | POA: Insufficient documentation

## 2020-03-23 IMAGING — CR DG HUMERUS 2V *R*
1 series · 3 of 3 positions shown · non-contrast
Comparison: None.

CLINICAL DATA: Right upper arm pain after injury. Fall abutting up
outside umbrella.

EXAM:
RIGHT HUMERUS - 2+ VIEW

[Series 1: dg humerus right · 0.14mm/px · 3 of 3 slices shown]
[im 1/3]
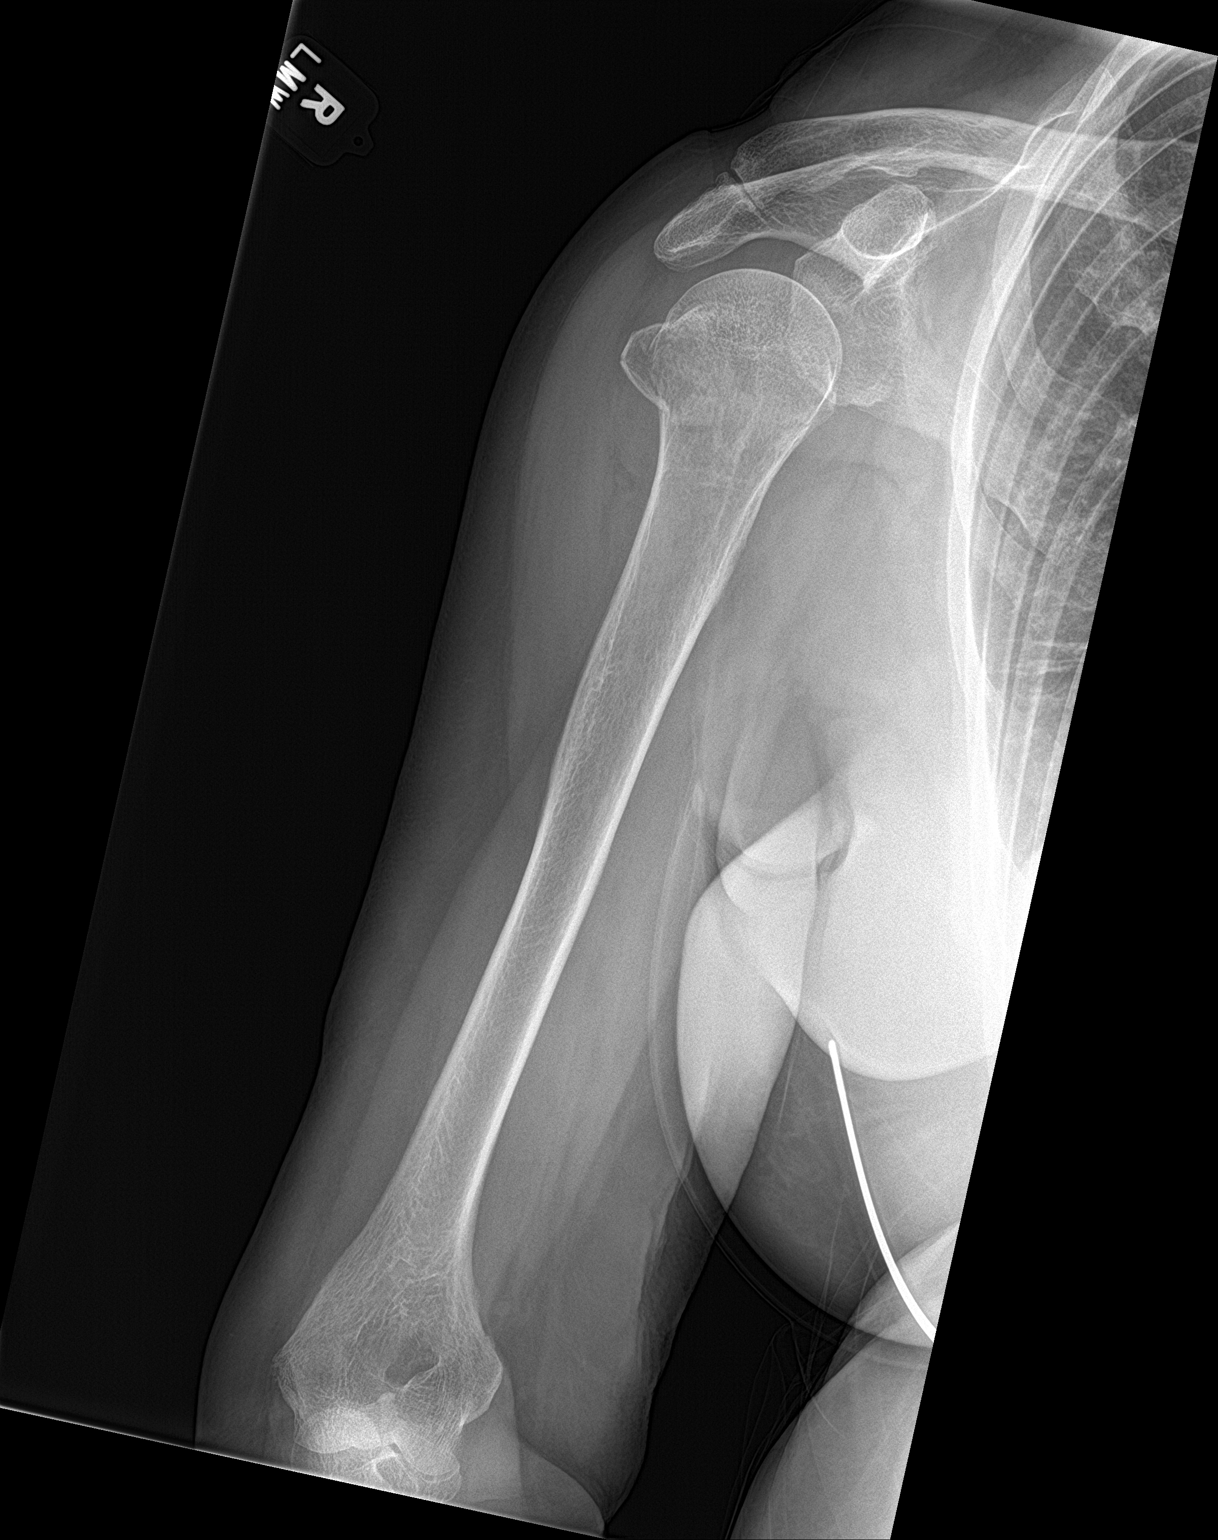
[im 2/3]
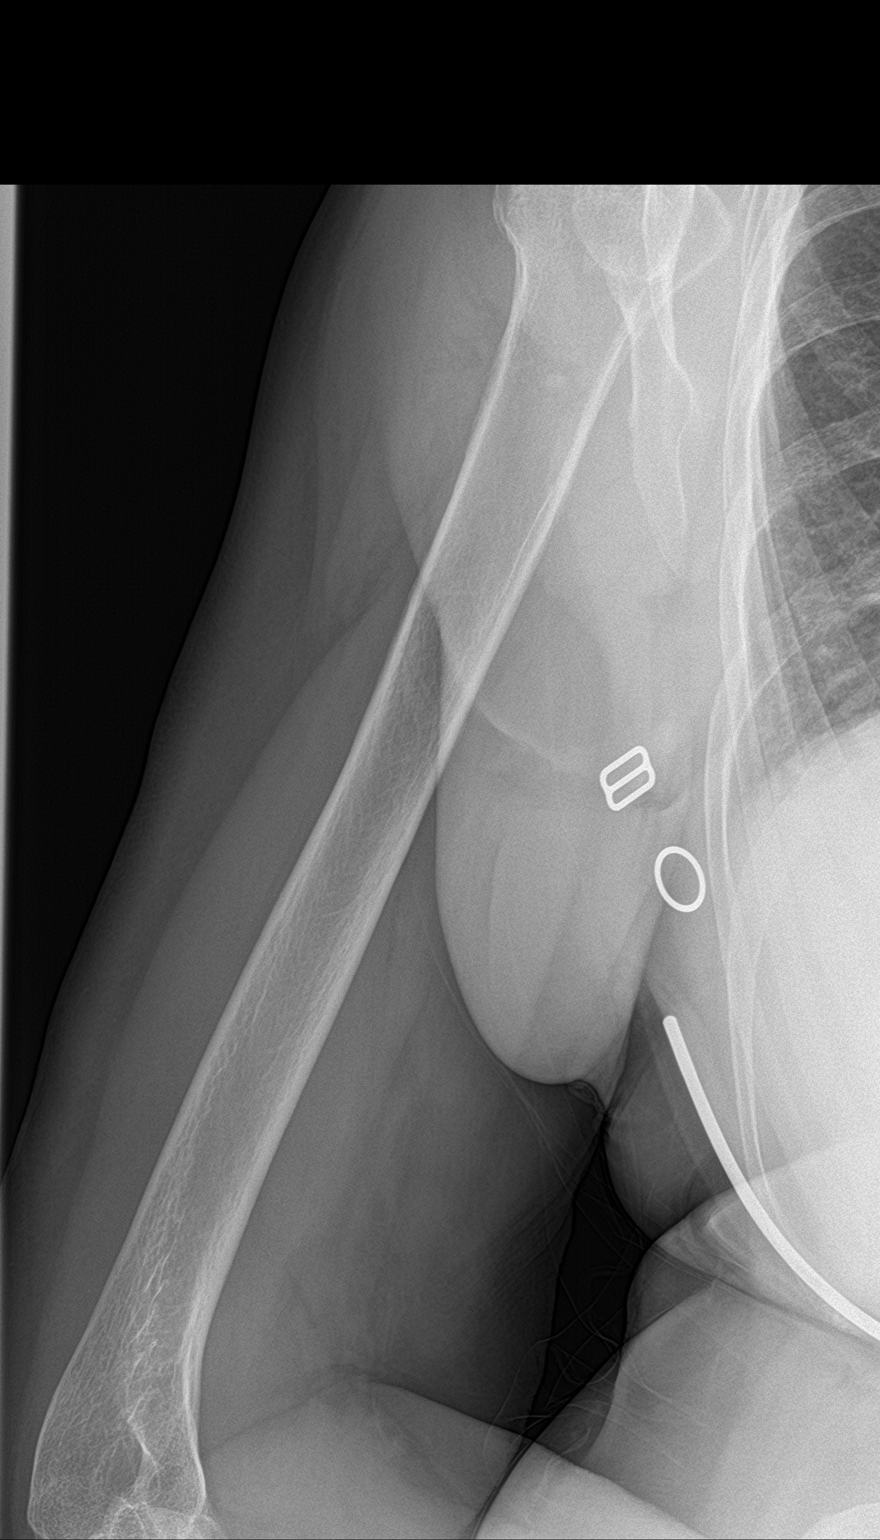
[im 3/3]
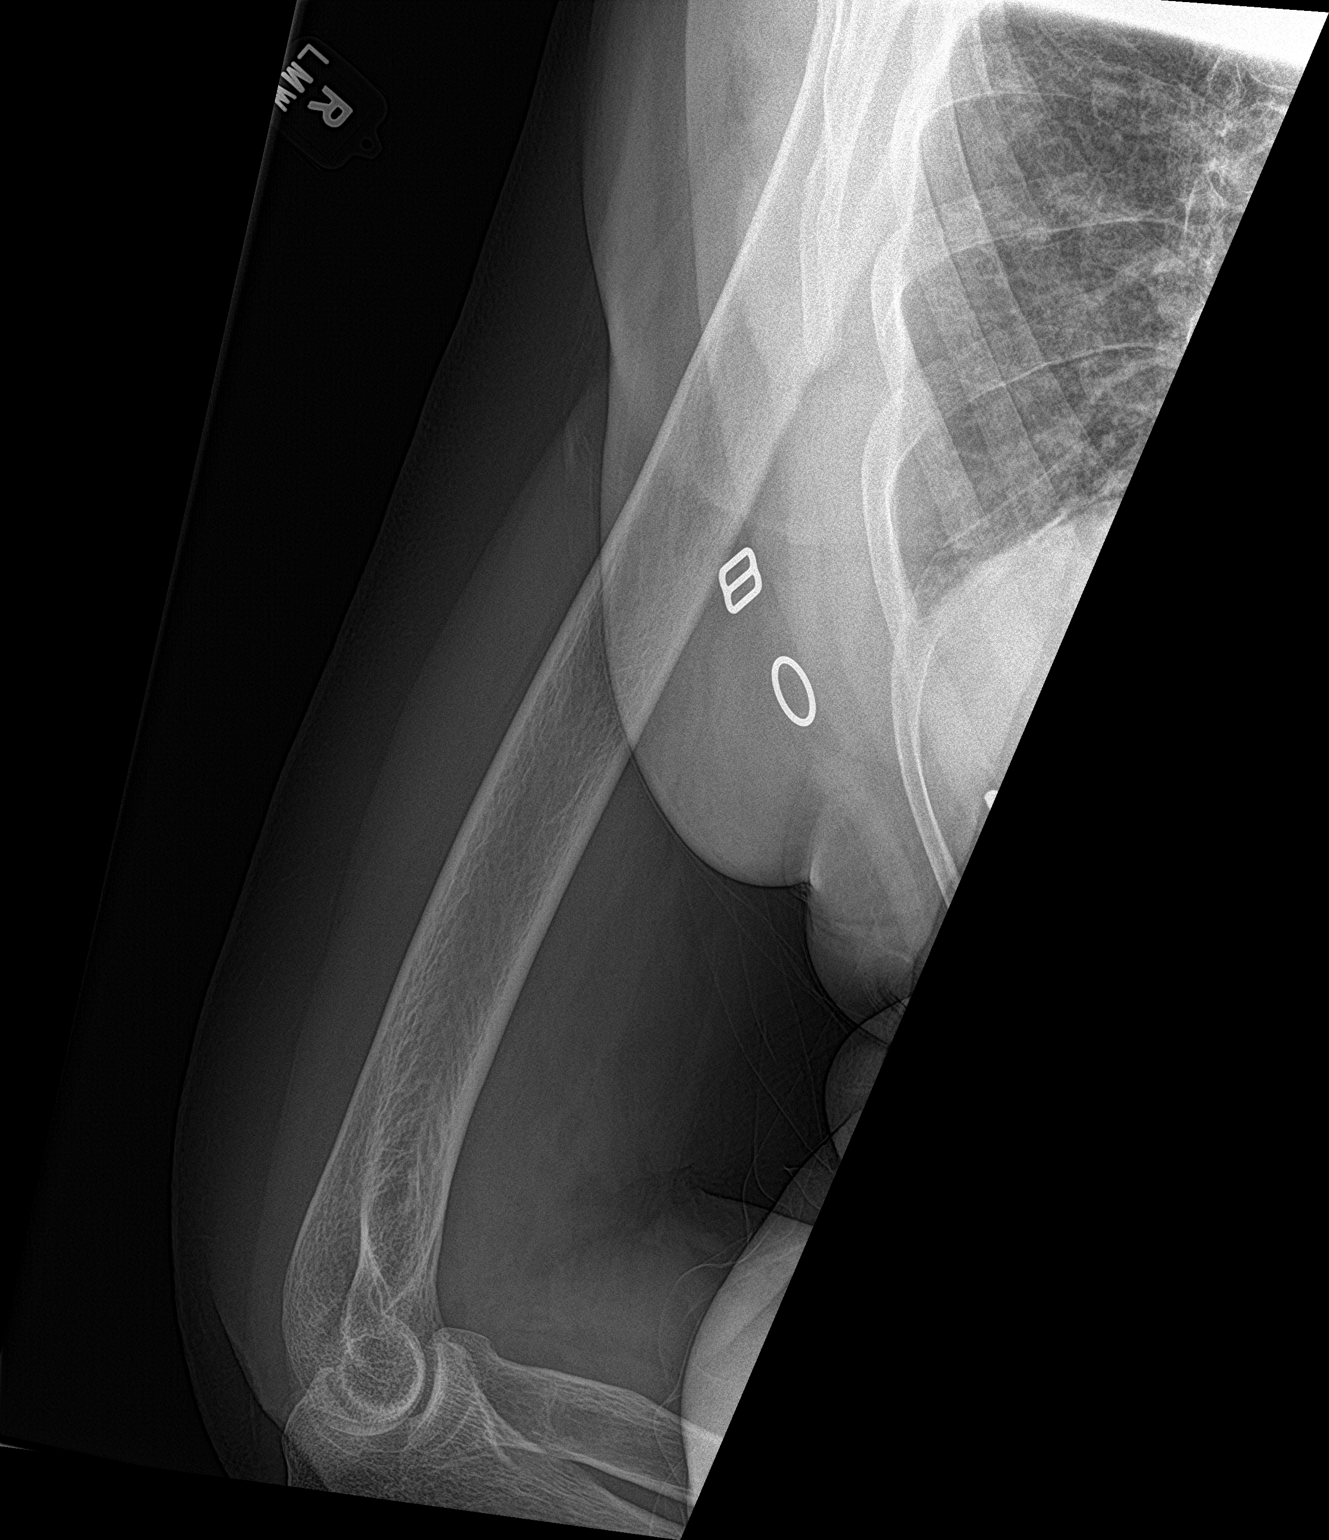

[3 of 3 positions shown; findings below may reference images not displayed]

FINDINGS: Mildly comminuted proximal humerus fracture with displacement
involving the greater tuberosity. There is likely transverse
nondisplaced component involving the surgical neck. No shoulder
dislocation. Distal humerus is intact.
IMPRESSION: Mildly comminuted proximal humerus fracture with displacement of
greater tuberosity and suspected nondisplaced component involving
the humeral neck.

## 2020-03-23 MED ORDER — TRAMADOL HCL 50 MG PO TABS
50.0000 mg | ORAL_TABLET | Freq: Once | ORAL | Status: AC
  Administered 2020-03-23: 50 mg via ORAL
  Filled 2020-03-23: qty 1

## 2020-03-23 MED ORDER — TRAMADOL HCL 50 MG PO TABS
50.0000 mg | ORAL_TABLET | Freq: Four times a day (QID) | ORAL | 0 refills | Status: AC | PRN
Start: 2020-03-23 — End: 2021-03-23

## 2020-03-23 NOTE — ED Provider Notes (Signed)
[facility] Emergency Department Provider Note  ____________________________________________  Time seen: Approximately 10:28 PM  I have reviewed the triage vital signs and the nursing notes.   HISTORY  Chief Complaint Fall    HPI Connie Glass is a 69 y.o. female That presents to the emergency department for evaluation of right shoulder pain after falling tonight.  Patient was trying to put up a umbrella stand on the patio when she tripped and fell.  She landed on her right shoulder.  She did not hit her head or lose consciousness.  She is not on any blood thinners.  No headache, neck pain, shortness of breath, chest pain.   Past Medical History:  Diagnosis Date  . Peripheral vascular disease [facility])     Patient Active Problem List   Diagnosis Date Noted  . Varicose veins of both lower extremities with inflammation 05/17/2016  . Chronic venous insufficiency 05/17/2016  . Pain in lower limb 05/17/2016  . Lymphedema 05/17/2016    Past Surgical History:  Procedure Laterality Date  . BREAST EXCISIONAL BIOPSY Left 2010   Benign     Prior to Admission medications   Medication Sig Start Date End Date Taking? Authorizing Provider  aspirin 81 MG chewable tablet Chew by mouth.    [provider]  azelastine (ASTELIN) 0.1 % nasal spray Place into the nose.    [provider]  Ergocalciferol (VITAMIN D2) 400 units TABS Take by mouth.    [provider]  loratadine (CLARITIN) 10 MG tablet Take by mouth.    [provider]  metFORMIN (GLUCOPHAGE-XR) 500 MG 24 hr tablet TAKE 1 TABLET BY MOUTH WITH THE EVENING MEAL 05/02/16   [provider]  thyroid (ARMOUR) 30 MG tablet Take by mouth. 09/23/15   [provider]  traMADol (ULTRAM) 50 MG tablet Take 1 tablet (50 mg total) by mouth every 6 (six) hours as needed. 03/23/20 03/23/21  Laban Emperor, PA-C  vitamin E 400 UNIT capsule Take by mouth.    [provider]    Allergies Other and Shellfish allergy  No family history on file.  Social History Social History   Tobacco Use  . Smoking status: Former Research scientist (life sciences)  . Smokeless tobacco: Former Network engineer Use Topics  . Alcohol use: Yes    Comment: social  . Drug use: No     Review of Systems  Constitutional: No fever/chills ENT: No upper respiratory complaints. Cardiovascular: No chest pain. Respiratory: No SOB. Gastrointestinal: No abdominal pain.  No nausea, no vomiting.  Musculoskeletal: Positive for shoulder pain. Skin: Negative for rash, abrasions, lacerations, ecchymosis. Neurological: Negative for headaches, numbness or tingling   ____________________________________________   PHYSICAL EXAM:  VITAL SIGNS: ED Triage Vitals  Enc Vitals Group     BP 03/23/20 1906 113/79     Pulse Rate 03/23/20 1906 76     Resp 03/23/20 1906 20     Temp --      Temp src --      SpO2 03/23/20 1906 99 %     Weight 03/23/20 1907 195 lb (88.5 kg)     Height 03/23/20 1907 5\' 10"  (1.778 m)     Head Circumference --      Peak Flow --      Pain Score 03/23/20 1907 2     Pain Loc --      Pain Edu? --      Excl. in [place]? --  Constitutional: Alert and oriented. Well appearing and in no acute distress. Eyes: Conjunctivae are normal. PERRL. EOMI. Head: Atraumatic. ENT:      Ears:      Nose: No congestion/rhinnorhea.      Mouth/Throat: Mucous membranes are moist.  Neck: No stridor.  Cardiovascular: Normal rate, regular rhythm.  Good peripheral circulation. Respiratory: Normal respiratory effort without tachypnea or retractions. Lungs CTAB. Good air entry to the bases with no decreased or absent breath sounds. Gastrointestinal: Bowel sounds 4 quadrants. Soft and nontender to palpation. No guarding or rigidity. No palpable masses. No distention.  Musculoskeletal: Full range of motion to all extremities. No gross deformities appreciated.  Tenderness to palpation to right  shoulder.  Full range of motion of shoulder but with pain. Neurologic:  Normal speech and language. No gross focal neurologic deficits are appreciated.  Skin:  Skin is warm, dry and intact. No rash noted. Psychiatric: Mood and affect are normal. Speech and behavior are normal. Patient exhibits appropriate insight and judgement.   ____________________________________________   LABS (all labs ordered are listed, but only abnormal results are displayed)  Labs Reviewed - No data to display ____________________________________________  EKG   ____________________________________________  RADIOLOGY Robinette Haines, personally viewed and evaluated these images (plain radiographs) as part of my medical decision making, as well as reviewing the written report by the radiologist.  DG Humerus Right  Result Date: 03/23/2020 CLINICAL DATA:  Right upper arm pain after injury. Fall abutting up outside umbrella. EXAM: RIGHT HUMERUS - 2+ VIEW COMPARISON:  None. FINDINGS: Mildly comminuted proximal humerus fracture with displacement involving the greater tuberosity. There is likely transverse nondisplaced component involving the surgical neck. No shoulder dislocation. Distal humerus is intact. IMPRESSION: Mildly comminuted proximal humerus fracture with displacement of greater tuberosity and suspected nondisplaced component involving the humeral neck. Electronically Signed   By: Keith Rake M.D.   On: 03/23/2020 19:40    ____________________________________________    PROCEDURES  Procedure(s) performed:    Procedures    Medications  traMADol (ULTRAM) tablet 50 mg (50 mg Oral Given 03/23/20 2232)     ____________________________________________   INITIAL IMPRESSION / ASSESSMENT AND PLAN / ED COURSE  Pertinent labs & imaging results that were available during my care of the patient were reviewed by me and considered in my medical decision making (see chart for details).  Review of  the  [place] was performed in accordance of the [place] prior to dispensing any controlled drugs.   Patient's diagnosis is consistent with proximal humerus fracture.  Vital signs and exam are reassuring.  X-ray consistent with fracture.  Sling was given. Patient will be discharged home with prescriptions for tramadol. Patient is to follow up with ortho as directed. Patient is given ED precautions to return to the ED for any worsening or new symptoms.  SAMIA KUKLA was evaluated in Emergency Department on 03/23/2020 for the symptoms described in the history of present illness. She was evaluated in the context of the global COVID-19 pandemic, which necessitated consideration that the patient might be at risk for infection with the SARS-CoV-2 virus that causes COVID-19. Institutional protocols and algorithms that pertain to the evaluation of patients at risk for COVID-19 are in a state of rapid change based on information released by regulatory bodies including the CDC and federal and state organizations. These policies and algorithms were followed during the patient's care in the ED.   ____________________________________________  FINAL CLINICAL IMPRESSION(S) / ED DIAGNOSES  Final diagnoses:  Fall,  initial encounter  Closed fracture of proximal end of right humerus, unspecified fracture morphology, initial encounter      NEW MEDICATIONS STARTED DURING THIS VISIT:  ED Discharge Orders         Ordered    traMADol (ULTRAM) 50 MG tablet  Every 6 hours PRN     Discontinue  Reprint     03/23/20 2233              This chart was dictated using voice recognition software/Dragon. Despite best efforts to proofread, errors can occur which can change the meaning. Any change was purely unintentional.    Laban Emperor, PA-C 03/23/20 2248    Arta Silence, MD 03/23/20 2337

## 2020-03-23 NOTE — ED Triage Notes (Signed)
Pt brought in with co right upper arm pain. States was outside putting umbrella up which fell over and she fell down with it. Pt co upper arm pain, no deformity noted. Sling in place.

## 2020-03-25 DIAGNOSIS — S42251A Displaced fracture of greater tuberosity of right humerus, initial encounter for closed fracture: Secondary | ICD-10-CM | POA: Diagnosis not present

## 2020-04-08 DIAGNOSIS — M25511 Pain in right shoulder: Secondary | ICD-10-CM | POA: Diagnosis not present

## 2020-04-22 DIAGNOSIS — Z9889 Other specified postprocedural states: Secondary | ICD-10-CM | POA: Diagnosis not present

## 2020-05-04 DIAGNOSIS — M25511 Pain in right shoulder: Secondary | ICD-10-CM | POA: Diagnosis not present

## 2020-05-10 DIAGNOSIS — M25511 Pain in right shoulder: Secondary | ICD-10-CM | POA: Diagnosis not present

## 2020-05-13 DIAGNOSIS — M25511 Pain in right shoulder: Secondary | ICD-10-CM | POA: Diagnosis not present

## 2020-05-16 DIAGNOSIS — R739 Hyperglycemia, unspecified: Secondary | ICD-10-CM | POA: Diagnosis not present

## 2020-05-16 DIAGNOSIS — R3121 Asymptomatic microscopic hematuria: Secondary | ICD-10-CM | POA: Diagnosis not present

## 2020-05-16 DIAGNOSIS — E034 Atrophy of thyroid (acquired): Secondary | ICD-10-CM | POA: Diagnosis not present

## 2020-05-16 DIAGNOSIS — E785 Hyperlipidemia, unspecified: Secondary | ICD-10-CM | POA: Diagnosis not present

## 2020-05-17 DIAGNOSIS — M25511 Pain in right shoulder: Secondary | ICD-10-CM | POA: Diagnosis not present

## 2020-05-18 DIAGNOSIS — S42251D Displaced fracture of greater tuberosity of right humerus, subsequent encounter for fracture with routine healing: Secondary | ICD-10-CM | POA: Diagnosis not present

## 2020-05-20 DIAGNOSIS — M25511 Pain in right shoulder: Secondary | ICD-10-CM | POA: Diagnosis not present

## 2020-05-24 DIAGNOSIS — M25511 Pain in right shoulder: Secondary | ICD-10-CM | POA: Diagnosis not present

## 2020-05-27 DIAGNOSIS — E785 Hyperlipidemia, unspecified: Secondary | ICD-10-CM | POA: Diagnosis not present

## 2020-05-27 DIAGNOSIS — R3121 Asymptomatic microscopic hematuria: Secondary | ICD-10-CM | POA: Diagnosis not present

## 2020-05-27 DIAGNOSIS — Z23 Encounter for immunization: Secondary | ICD-10-CM | POA: Diagnosis not present

## 2020-05-27 DIAGNOSIS — R739 Hyperglycemia, unspecified: Secondary | ICD-10-CM | POA: Diagnosis not present

## 2020-05-27 DIAGNOSIS — M25511 Pain in right shoulder: Secondary | ICD-10-CM | POA: Diagnosis not present

## 2020-05-27 DIAGNOSIS — M519 Unspecified thoracic, thoracolumbar and lumbosacral intervertebral disc disorder: Secondary | ICD-10-CM | POA: Diagnosis not present

## 2020-05-27 DIAGNOSIS — E034 Atrophy of thyroid (acquired): Secondary | ICD-10-CM | POA: Diagnosis not present

## 2020-05-31 DIAGNOSIS — M25511 Pain in right shoulder: Secondary | ICD-10-CM | POA: Diagnosis not present

## 2020-06-03 DIAGNOSIS — M25511 Pain in right shoulder: Secondary | ICD-10-CM | POA: Diagnosis not present

## 2020-06-06 DIAGNOSIS — M25511 Pain in right shoulder: Secondary | ICD-10-CM | POA: Diagnosis not present

## 2020-06-09 DIAGNOSIS — M25511 Pain in right shoulder: Secondary | ICD-10-CM | POA: Diagnosis not present

## 2020-06-16 DIAGNOSIS — M25511 Pain in right shoulder: Secondary | ICD-10-CM | POA: Diagnosis not present

## 2020-06-17 DIAGNOSIS — S42251D Displaced fracture of greater tuberosity of right humerus, subsequent encounter for fracture with routine healing: Secondary | ICD-10-CM | POA: Diagnosis not present

## 2020-06-20 DIAGNOSIS — M25511 Pain in right shoulder: Secondary | ICD-10-CM | POA: Diagnosis not present

## 2020-06-23 DIAGNOSIS — M25511 Pain in right shoulder: Secondary | ICD-10-CM | POA: Diagnosis not present

## 2020-06-27 DIAGNOSIS — M25511 Pain in right shoulder: Secondary | ICD-10-CM | POA: Diagnosis not present

## 2020-07-05 DIAGNOSIS — M25511 Pain in right shoulder: Secondary | ICD-10-CM | POA: Diagnosis not present

## 2020-07-12 DIAGNOSIS — M25511 Pain in right shoulder: Secondary | ICD-10-CM | POA: Diagnosis not present

## 2020-07-15 DIAGNOSIS — M25511 Pain in right shoulder: Secondary | ICD-10-CM | POA: Diagnosis not present

## 2020-07-19 DIAGNOSIS — M25511 Pain in right shoulder: Secondary | ICD-10-CM | POA: Diagnosis not present

## 2020-07-25 DIAGNOSIS — M25511 Pain in right shoulder: Secondary | ICD-10-CM | POA: Diagnosis not present

## 2020-07-26 DIAGNOSIS — S42201A Unspecified fracture of upper end of right humerus, initial encounter for closed fracture: Secondary | ICD-10-CM | POA: Diagnosis not present

## 2020-08-02 DIAGNOSIS — M25511 Pain in right shoulder: Secondary | ICD-10-CM | POA: Diagnosis not present

## 2020-08-10 DIAGNOSIS — M25511 Pain in right shoulder: Secondary | ICD-10-CM | POA: Diagnosis not present

## 2020-08-12 DIAGNOSIS — M25511 Pain in right shoulder: Secondary | ICD-10-CM | POA: Diagnosis not present

## 2020-08-17 DIAGNOSIS — M25511 Pain in right shoulder: Secondary | ICD-10-CM | POA: Diagnosis not present

## 2020-08-18 DIAGNOSIS — E890 Postprocedural hypothyroidism: Secondary | ICD-10-CM | POA: Diagnosis not present

## 2020-08-24 DIAGNOSIS — M25511 Pain in right shoulder: Secondary | ICD-10-CM | POA: Diagnosis not present

## 2020-08-30 DIAGNOSIS — M25511 Pain in right shoulder: Secondary | ICD-10-CM | POA: Diagnosis not present

## 2020-09-12 ENCOUNTER — Other Ambulatory Visit: Payer: Self-pay | Admitting: Orthopaedic Surgery

## 2020-09-12 ENCOUNTER — Other Ambulatory Visit (HOSPITAL_COMMUNITY): Payer: Self-pay | Admitting: Orthopaedic Surgery

## 2020-09-12 DIAGNOSIS — S42201S Unspecified fracture of upper end of right humerus, sequela: Secondary | ICD-10-CM | POA: Diagnosis not present

## 2020-09-12 DIAGNOSIS — M7501 Adhesive capsulitis of right shoulder: Secondary | ICD-10-CM

## 2020-09-12 DIAGNOSIS — M25511 Pain in right shoulder: Secondary | ICD-10-CM | POA: Diagnosis not present

## 2020-09-16 DIAGNOSIS — E049 Nontoxic goiter, unspecified: Secondary | ICD-10-CM | POA: Diagnosis not present

## 2020-09-16 DIAGNOSIS — E7800 Pure hypercholesterolemia, unspecified: Secondary | ICD-10-CM | POA: Diagnosis not present

## 2020-09-16 DIAGNOSIS — R635 Abnormal weight gain: Secondary | ICD-10-CM | POA: Diagnosis not present

## 2020-09-16 DIAGNOSIS — E890 Postprocedural hypothyroidism: Secondary | ICD-10-CM | POA: Diagnosis not present

## 2020-09-16 DIAGNOSIS — E6609 Other obesity due to excess calories: Secondary | ICD-10-CM | POA: Diagnosis not present

## 2020-09-16 DIAGNOSIS — R7301 Impaired fasting glucose: Secondary | ICD-10-CM | POA: Diagnosis not present

## 2020-09-22 ENCOUNTER — Ambulatory Visit: Payer: PPO

## 2020-10-05 ENCOUNTER — Other Ambulatory Visit: Payer: Self-pay

## 2020-10-05 ENCOUNTER — Ambulatory Visit
Admission: RE | Admit: 2020-10-05 | Discharge: 2020-10-05 | Disposition: A | Discharge status: Home / Self Care | Payer: PPO | Source: Ambulatory Visit | Attending: Orthopaedic Surgery | Admitting: Orthopaedic Surgery

## 2020-10-05 DIAGNOSIS — M7501 Adhesive capsulitis of right shoulder: Secondary | ICD-10-CM | POA: Diagnosis not present

## 2020-10-05 DIAGNOSIS — M25511 Pain in right shoulder: Secondary | ICD-10-CM | POA: Insufficient documentation

## 2020-10-05 DIAGNOSIS — M75101 Unspecified rotator cuff tear or rupture of right shoulder, not specified as traumatic: Secondary | ICD-10-CM | POA: Diagnosis not present

## 2020-10-05 DIAGNOSIS — M19011 Primary osteoarthritis, right shoulder: Secondary | ICD-10-CM | POA: Diagnosis not present

## 2020-10-05 IMAGING — MR MR SHOULDER*R* W/O CM
4 of 5 series · 32 of 40 positions shown · non-contrast
Comparison: Plain films right humerus [DATE].

CLINICAL DATA: The patient suffered a proximal right humerus
fracture in a fall off a patio [DATE]. Continued pain.

EXAM:
MRI OF THE RIGHT SHOULDER WITHOUT CONTRAST
TECHNIQUE: Multiplanar, multisequence MR imaging of the shoulder was performed.
No intravenous contrast was administered.

[Series 5: T2 fat-sat · axial · right · 4.0mm · 0.44mm/px · z∈[-56,+63]mm · 8 of 26 slices shown (1 of 3)]
[im 1/26]
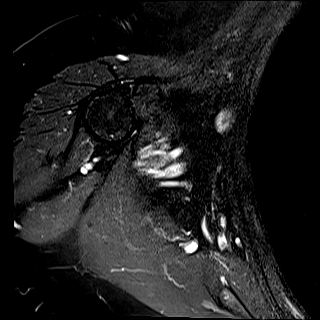
[im 4/26]
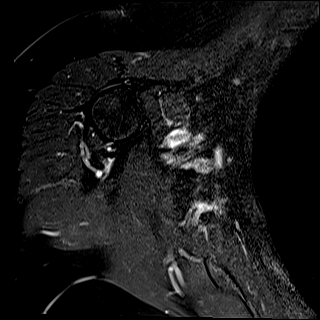
[im 8/26]
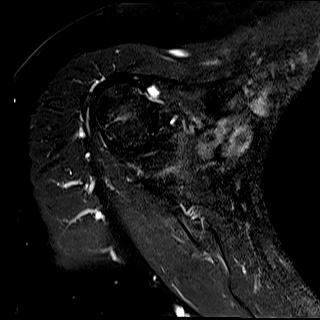
[im 11/26]
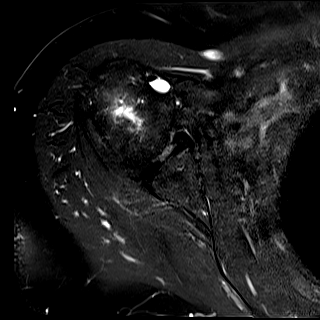
[im 15/26]
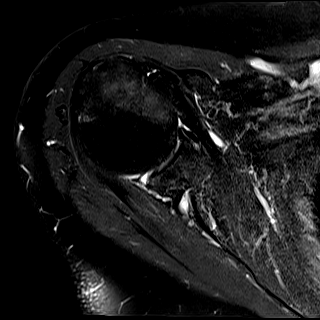
[im 18/26]
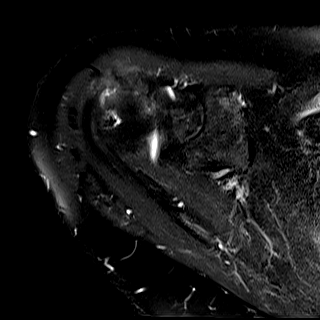
[im 22/26]
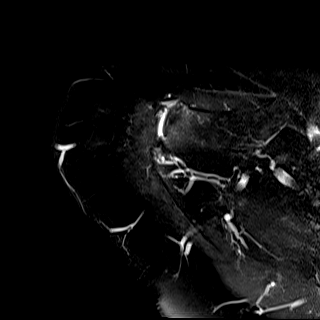
[im 26/26]
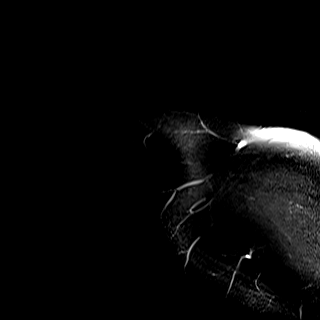

[Series 6: PD · oblique · right · 4.0mm · 0.44mm/px · 9 of 26 slices shown]
[im 1/26]
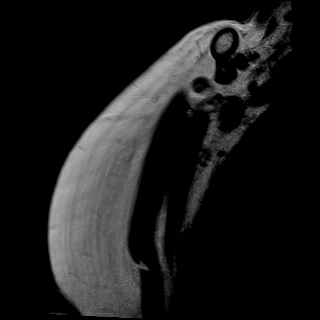
[im 4/26]
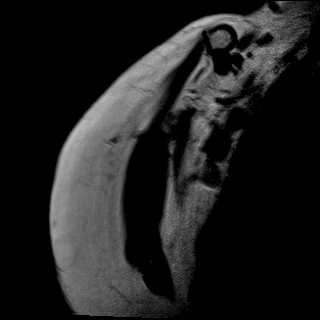
[im 7/26]
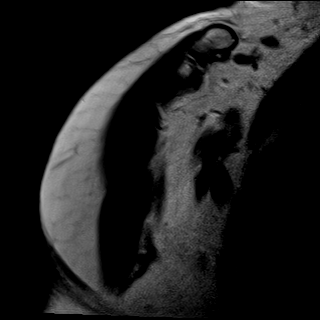
[im 10/26]
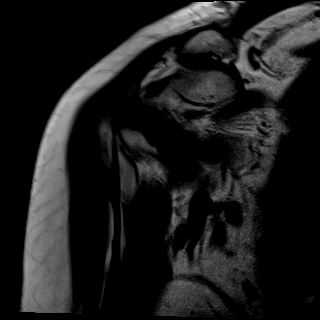
[im 13/26]
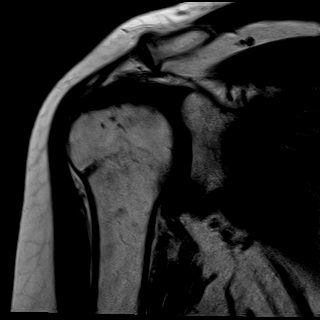
[im 16/26]
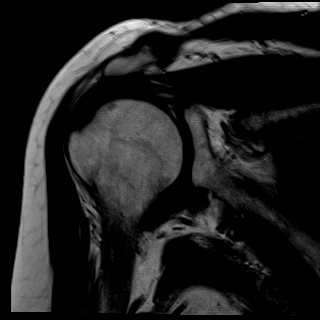
[im 19/26]
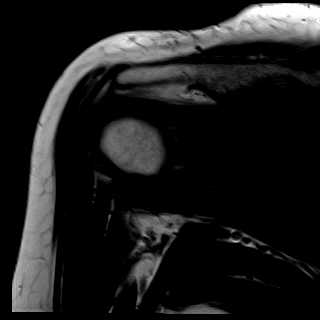
[im 22/26]
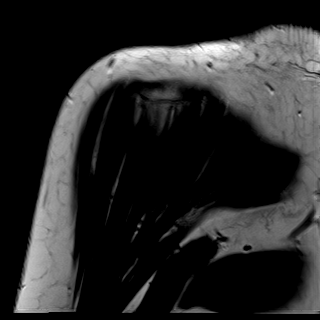
[im 26/26]
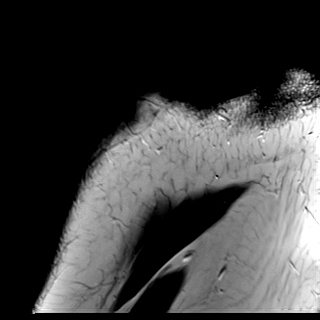

[Series 7: T2 fat-sat · oblique · right · 4.0mm · 0.44mm/px · 9 of 26 slices shown (2 of 3)]
[im 1/26]
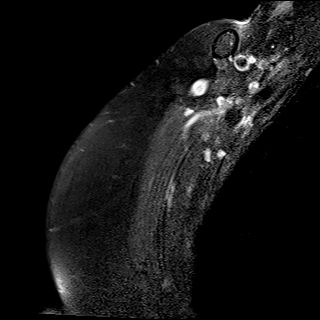
[im 4/26]
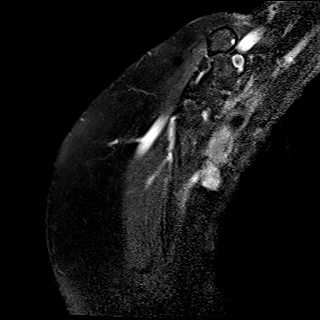
[im 7/26]
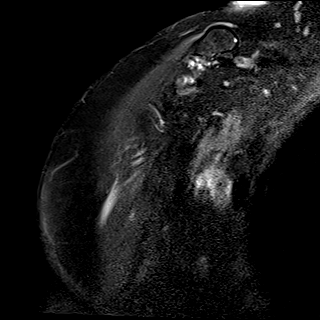
[im 10/26]
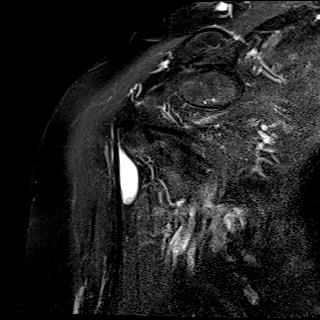
[im 13/26]
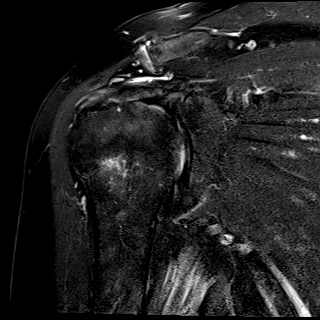
[im 16/26]
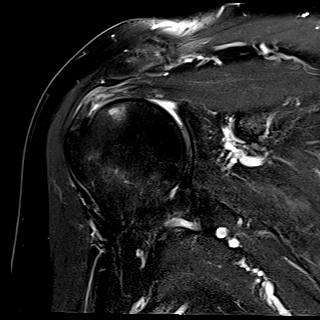
[im 19/26]
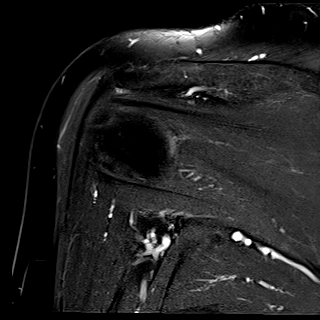
[im 22/26]
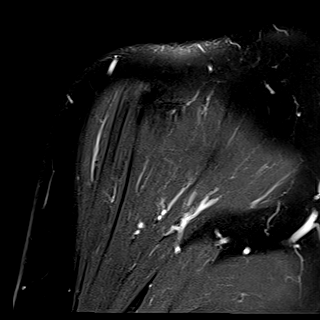
[im 26/26]
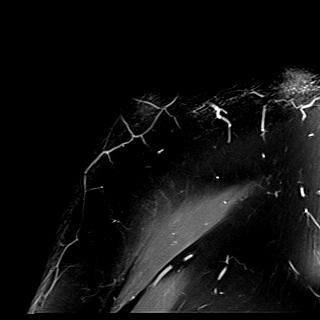

[Series 8: T2 fat-sat · oblique · right · 4.0mm · 0.23mm/px · 6 of 22 slices shown (3 of 3)]
[im 1/22]
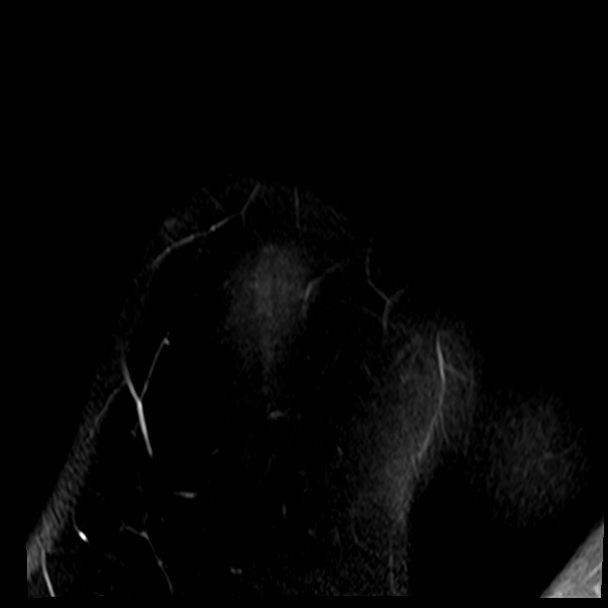
[im 4/22]
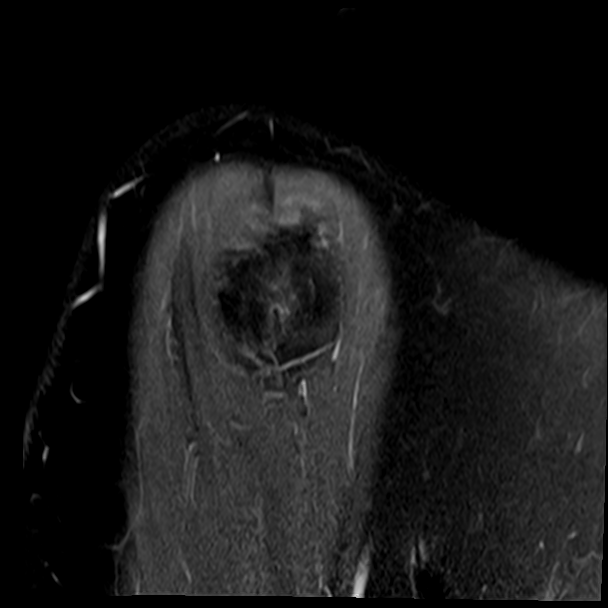
[im 8/22]
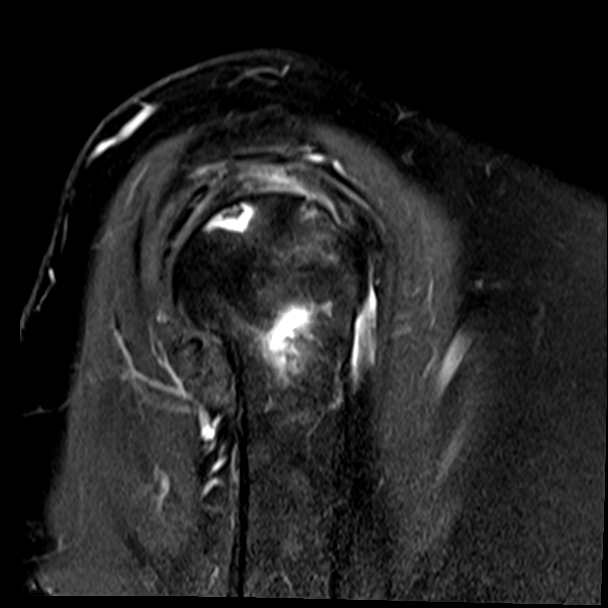
[im 11/22]
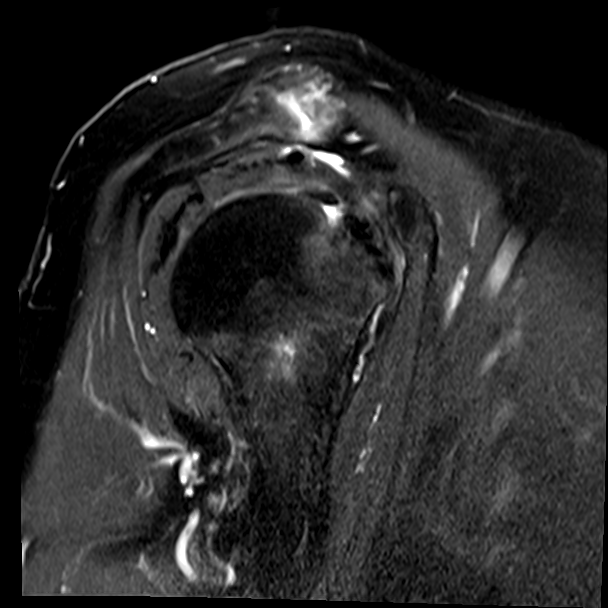
[im 15/22]
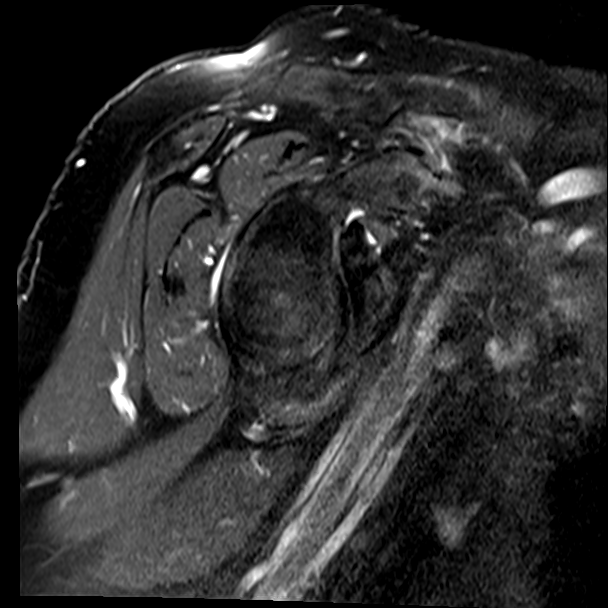
[im 18/22]
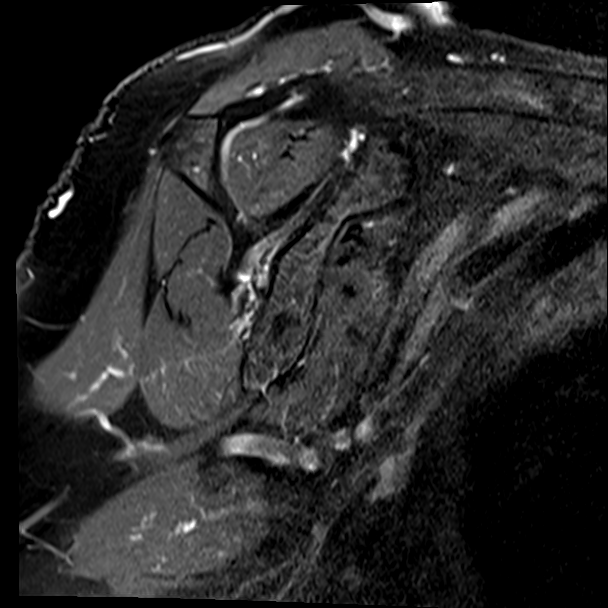

[32 of 40 positions shown; findings below may reference images not displayed]

FINDINGS: Rotator cuff: The patient has moderate to moderately severe
supraspinatus worse than infraspinatus tendinopathy. A punctate
undersurface tear of the far lateral supraspinatus measures 1-2 mm
from front to back. No retraction.

Muscles:  Normal without atrophy or focal lesion.

Biceps long head:  Intact.

Acromioclavicular Joint: Moderate osteoarthritis. Type 1 acromion.
No subacromial/subdeltoid bursal fluid.

Glenohumeral Joint: Negative.

Labrum: The superior labrum is degenerated and frayed. The patient
has a tear of the posterosuperior labrum as best seen on images
10-13 of series 5.

Bones: The patient's fracture is solidly healed in anatomic position
and alignment. There is a focus of avascular necrosis of the
superior aspect of the humeral head which measures 0.7 cm transverse
by 0.8 cm AP. No fragmentation or collapse.

Other: None.
IMPRESSION: Solidly healed fracture the proximal humerus in anatomic position
and alignment.

Supraspinatus worse than infraspinatus tendinosis. There is only a
punctate undersurface tear of the far lateral supraspinatus without
retraction or atrophy.

0.7 x 0.8 cm focus of avascular necrosis of the superior aspect of
the humeral head.

Moderate acromioclavicular osteoarthritis.

Fraying of the superior labrum and tearing of the posterosuperior
labrum.

## 2020-10-10 DIAGNOSIS — S42251S Displaced fracture of greater tuberosity of right humerus, sequela: Secondary | ICD-10-CM | POA: Diagnosis not present

## 2020-10-25 DIAGNOSIS — M25511 Pain in right shoulder: Secondary | ICD-10-CM | POA: Diagnosis not present

## 2020-11-02 DIAGNOSIS — M25511 Pain in right shoulder: Secondary | ICD-10-CM | POA: Diagnosis not present

## 2020-11-03 ENCOUNTER — Other Ambulatory Visit: Payer: Self-pay | Admitting: Internal Medicine

## 2020-11-03 DIAGNOSIS — J301 Allergic rhinitis due to pollen: Secondary | ICD-10-CM | POA: Diagnosis not present

## 2020-11-03 DIAGNOSIS — H1045 Other chronic allergic conjunctivitis: Secondary | ICD-10-CM | POA: Diagnosis not present

## 2020-11-03 DIAGNOSIS — Z1231 Encounter for screening mammogram for malignant neoplasm of breast: Secondary | ICD-10-CM

## 2020-11-03 DIAGNOSIS — Z91013 Allergy to seafood: Secondary | ICD-10-CM | POA: Diagnosis not present

## 2020-11-03 DIAGNOSIS — J3089 Other allergic rhinitis: Secondary | ICD-10-CM | POA: Diagnosis not present

## 2020-11-07 DIAGNOSIS — M25511 Pain in right shoulder: Secondary | ICD-10-CM | POA: Diagnosis not present

## 2020-11-08 DIAGNOSIS — M25511 Pain in right shoulder: Secondary | ICD-10-CM | POA: Diagnosis not present

## 2020-11-10 DIAGNOSIS — M25511 Pain in right shoulder: Secondary | ICD-10-CM | POA: Diagnosis not present

## 2020-11-14 DIAGNOSIS — M25511 Pain in right shoulder: Secondary | ICD-10-CM | POA: Diagnosis not present

## 2020-11-17 DIAGNOSIS — M25511 Pain in right shoulder: Secondary | ICD-10-CM | POA: Diagnosis not present

## 2020-11-22 DIAGNOSIS — M25511 Pain in right shoulder: Secondary | ICD-10-CM | POA: Diagnosis not present

## 2020-11-23 DIAGNOSIS — Z961 Presence of intraocular lens: Secondary | ICD-10-CM | POA: Diagnosis not present

## 2020-11-23 DIAGNOSIS — H43393 Other vitreous opacities, bilateral: Secondary | ICD-10-CM | POA: Diagnosis not present

## 2020-11-23 DIAGNOSIS — H40053 Ocular hypertension, bilateral: Secondary | ICD-10-CM | POA: Diagnosis not present

## 2020-11-23 DIAGNOSIS — H26491 Other secondary cataract, right eye: Secondary | ICD-10-CM | POA: Diagnosis not present

## 2020-11-29 DIAGNOSIS — M25511 Pain in right shoulder: Secondary | ICD-10-CM | POA: Diagnosis not present

## 2020-12-01 DIAGNOSIS — M25511 Pain in right shoulder: Secondary | ICD-10-CM | POA: Diagnosis not present

## 2020-12-05 DIAGNOSIS — M25511 Pain in right shoulder: Secondary | ICD-10-CM | POA: Diagnosis not present

## 2020-12-08 DIAGNOSIS — M25511 Pain in right shoulder: Secondary | ICD-10-CM | POA: Diagnosis not present

## 2020-12-09 DIAGNOSIS — M25511 Pain in right shoulder: Secondary | ICD-10-CM | POA: Diagnosis not present

## 2020-12-23 ENCOUNTER — Other Ambulatory Visit: Payer: Self-pay

## 2020-12-23 ENCOUNTER — Ambulatory Visit
Admission: RE | Admit: 2020-12-23 | Discharge: 2020-12-23 | Disposition: A | Discharge status: Home / Self Care | Payer: PPO | Source: Ambulatory Visit | Attending: Internal Medicine | Admitting: Internal Medicine

## 2020-12-23 DIAGNOSIS — Z1231 Encounter for screening mammogram for malignant neoplasm of breast: Secondary | ICD-10-CM

## 2020-12-23 IMAGING — MG MM DIGITAL SCREENING BILAT W/ TOMO AND CAD
8 series · 8 of 24 positions shown · non-contrast
Comparison: Previous exam(s).

CLINICAL DATA: Screening.

EXAM:
DIGITAL SCREENING BILATERAL MAMMOGRAM WITH TOMOSYNTHESIS AND CAD
TECHNIQUE: Bilateral screening digital craniocaudal and mediolateral oblique
mammograms were obtained. Bilateral screening digital breast
tomosynthesis was performed. The images were evaluated with
computer-aided detection.

[L MLO synth-2D]
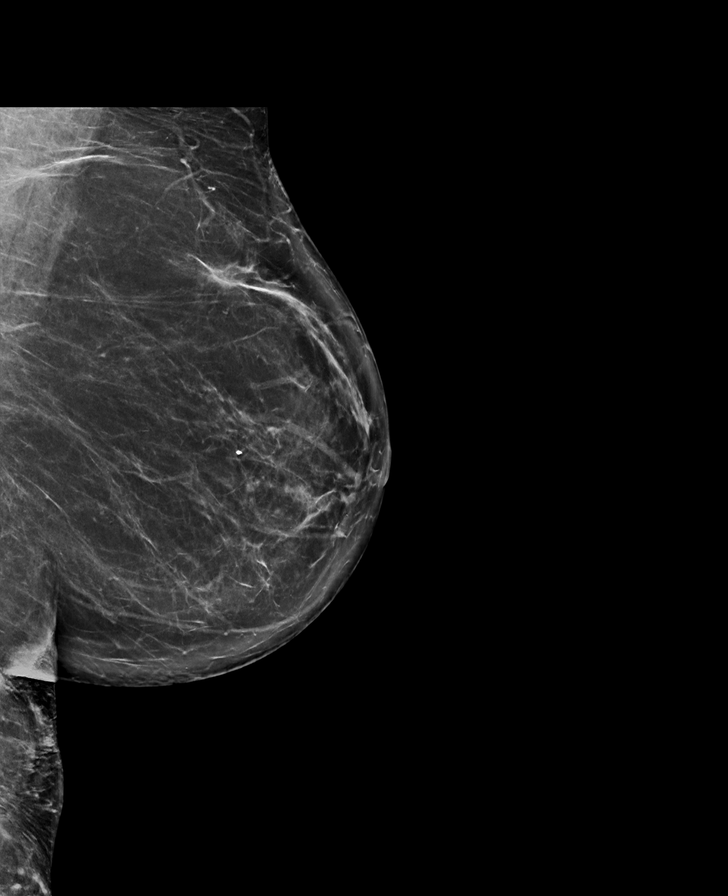

[L CC synth-2D]
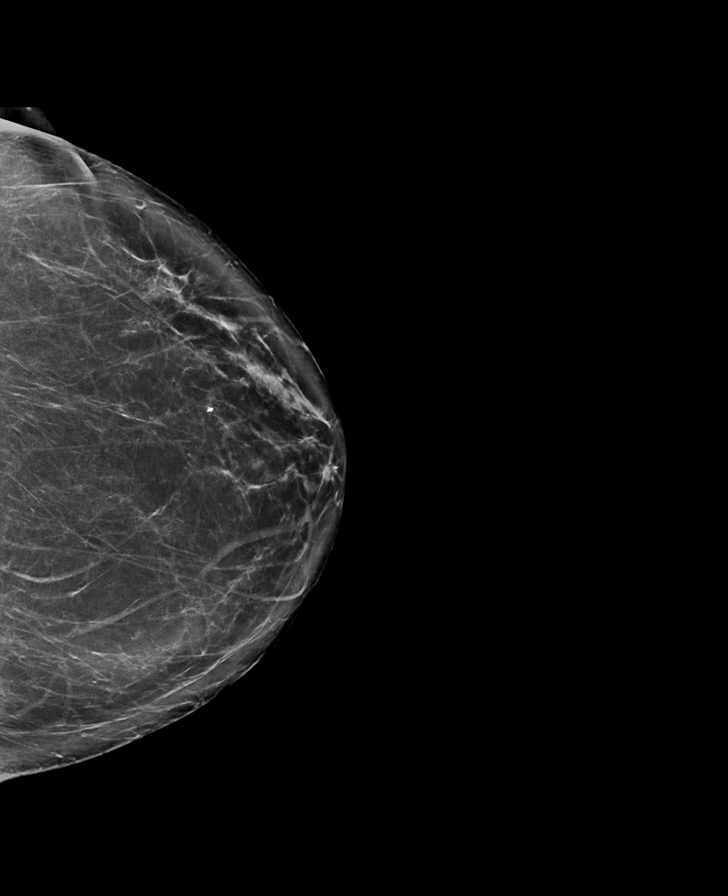

[R MLO synth-2D]
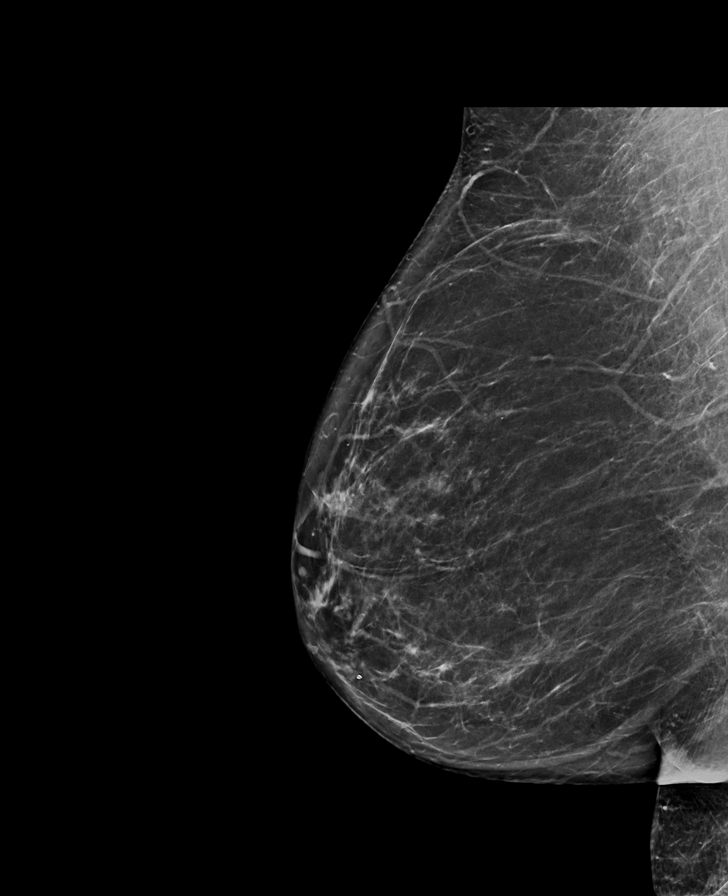

[R CC synth-2D]
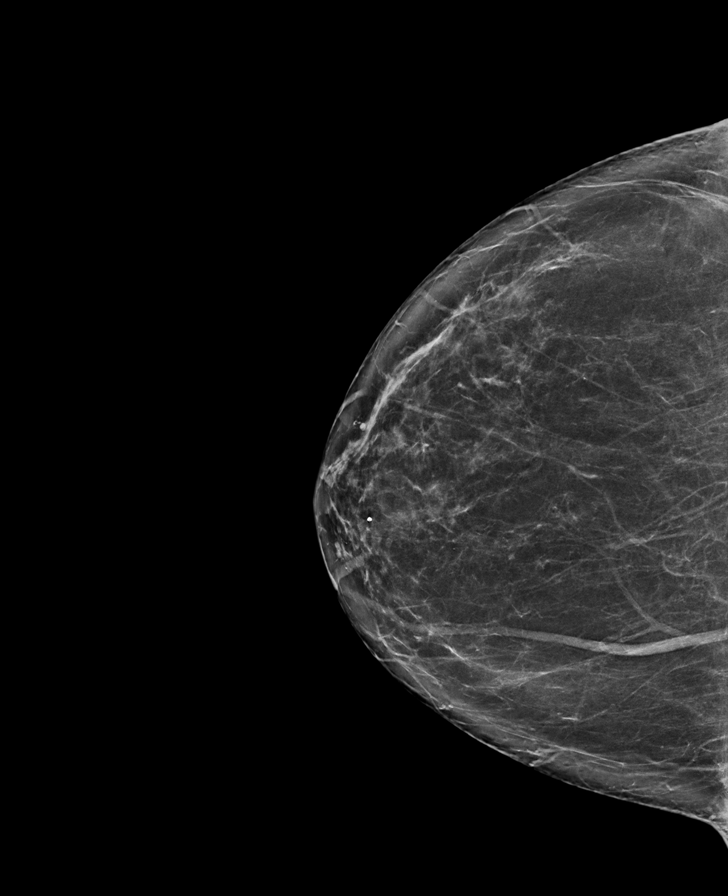

[L MLO tomo · tomo slice 43/85.0]
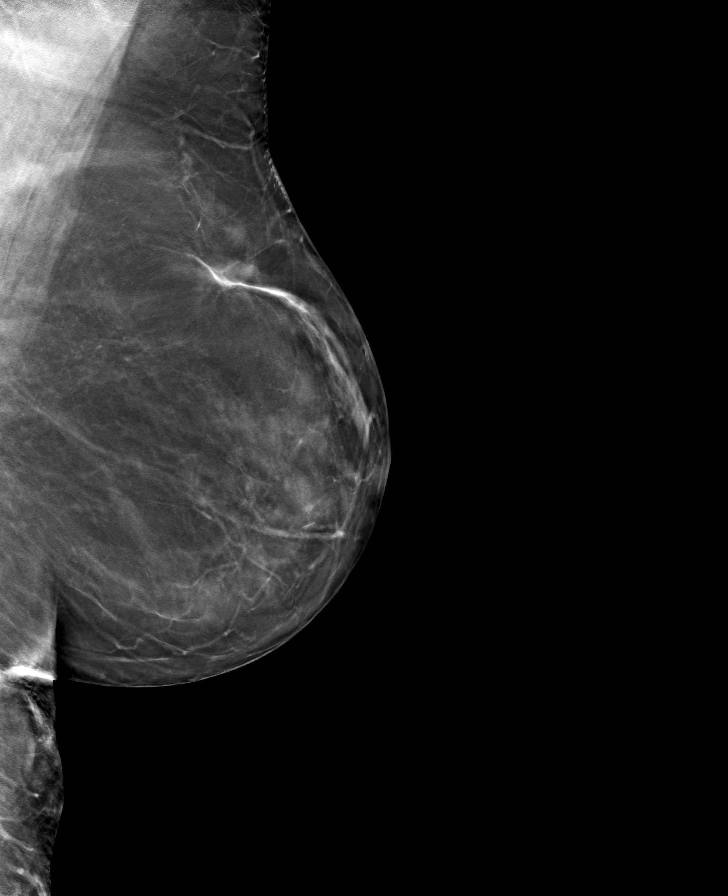

[R CC tomo · tomo slice 41/80.0]
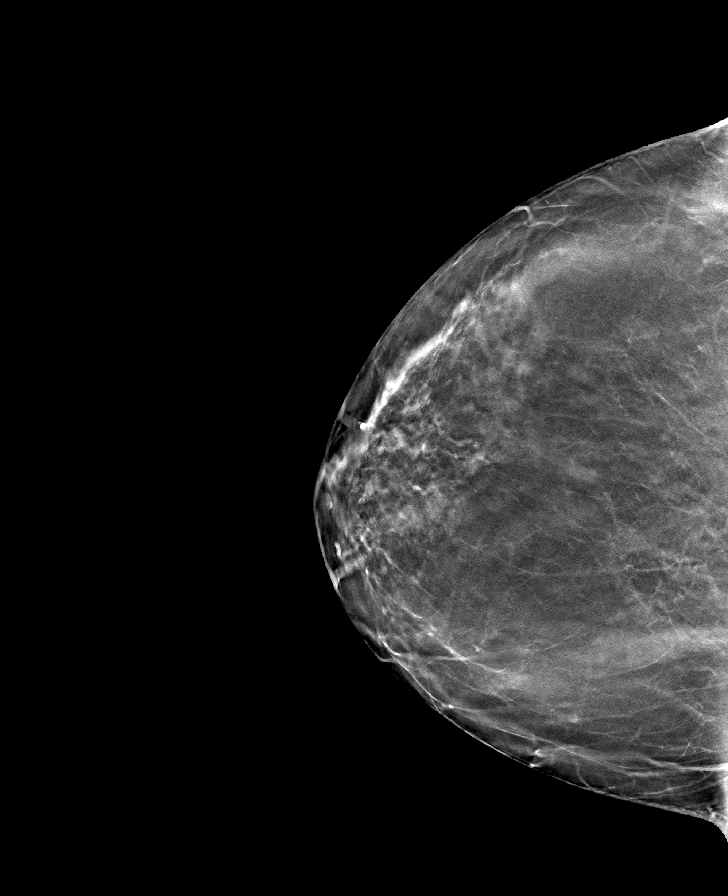

[R MLO tomo · tomo slice 42/83.0]
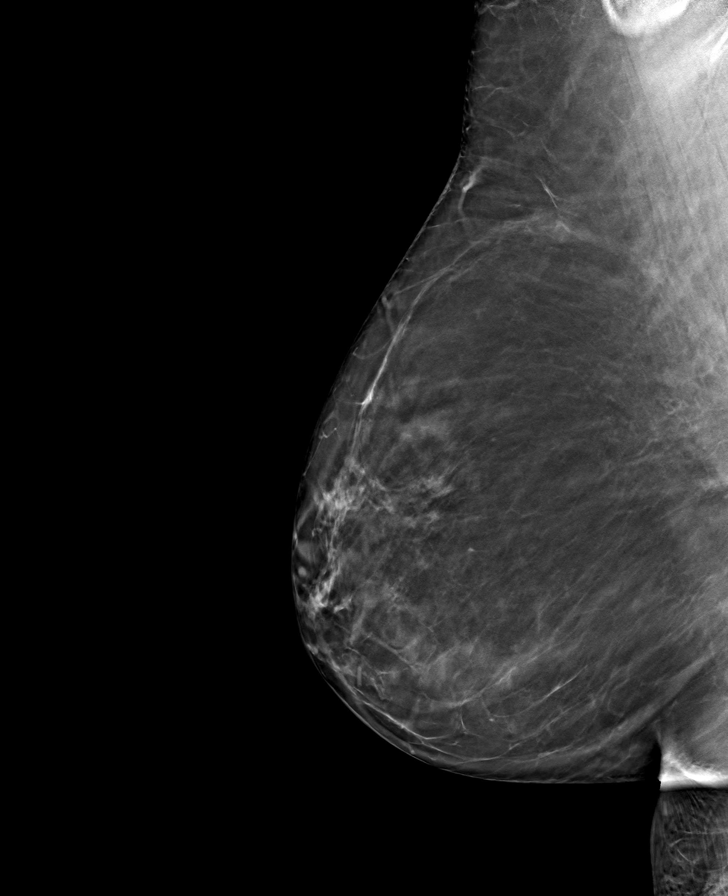

[L CC tomo · tomo slice 45/88.0]
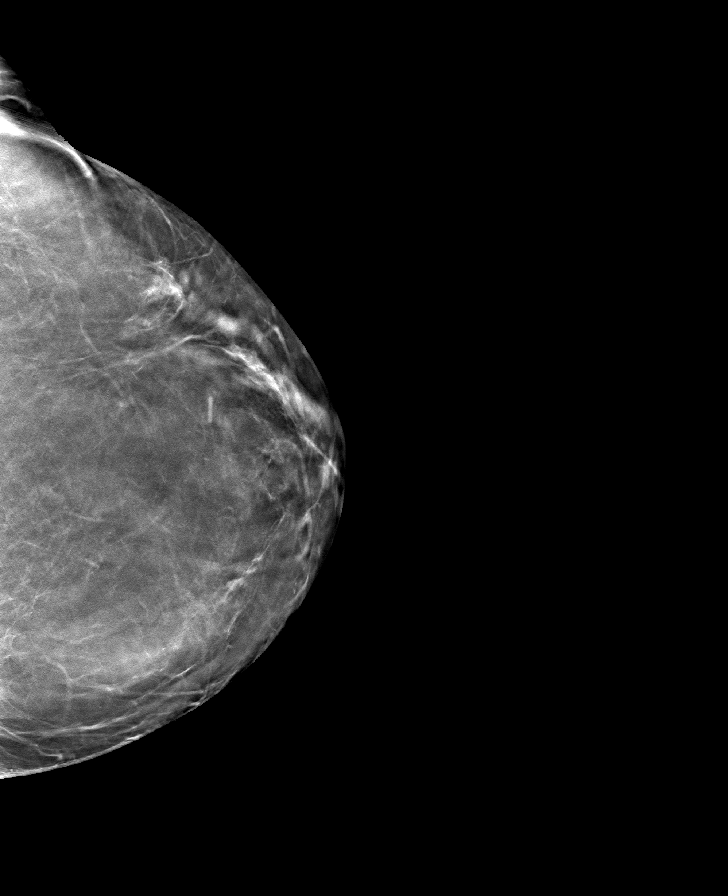

[8 of 24 positions shown; findings below may reference images not displayed]

ACR Breast Density Category b: There are scattered areas of
fibroglandular density.
FINDINGS: There are no findings suspicious for malignancy. The images were
evaluated with computer-aided detection.
IMPRESSION: No mammographic evidence of malignancy. A result letter of this
screening mammogram will be mailed directly to the patient.

RECOMMENDATION:
Screening mammogram in one year. (Code:[OD])

BI-RADS CATEGORY  1: Negative.

## 2020-12-27 ENCOUNTER — Ambulatory Visit: Payer: PPO | Admitting: Dermatology

## 2021-02-03 DIAGNOSIS — M25511 Pain in right shoulder: Secondary | ICD-10-CM | POA: Diagnosis not present

## 2021-03-02 DIAGNOSIS — M25711 Osteophyte, right shoulder: Secondary | ICD-10-CM | POA: Diagnosis not present

## 2021-03-02 DIAGNOSIS — M7501 Adhesive capsulitis of right shoulder: Secondary | ICD-10-CM | POA: Diagnosis not present

## 2021-03-02 DIAGNOSIS — M75111 Incomplete rotator cuff tear or rupture of right shoulder, not specified as traumatic: Secondary | ICD-10-CM | POA: Diagnosis not present

## 2021-03-02 DIAGNOSIS — G8918 Other acute postprocedural pain: Secondary | ICD-10-CM | POA: Diagnosis not present

## 2021-03-15 DIAGNOSIS — M25511 Pain in right shoulder: Secondary | ICD-10-CM | POA: Diagnosis not present

## 2021-03-20 DIAGNOSIS — M25511 Pain in right shoulder: Secondary | ICD-10-CM | POA: Diagnosis not present

## 2021-03-22 DIAGNOSIS — M25511 Pain in right shoulder: Secondary | ICD-10-CM | POA: Diagnosis not present

## 2021-03-28 DIAGNOSIS — M25511 Pain in right shoulder: Secondary | ICD-10-CM | POA: Diagnosis not present

## 2021-03-31 DIAGNOSIS — M25511 Pain in right shoulder: Secondary | ICD-10-CM | POA: Diagnosis not present

## 2021-04-04 DIAGNOSIS — M25511 Pain in right shoulder: Secondary | ICD-10-CM | POA: Diagnosis not present

## 2021-04-07 DIAGNOSIS — M25511 Pain in right shoulder: Secondary | ICD-10-CM | POA: Diagnosis not present

## 2021-04-11 DIAGNOSIS — M25511 Pain in right shoulder: Secondary | ICD-10-CM | POA: Diagnosis not present

## 2021-04-26 DIAGNOSIS — M25511 Pain in right shoulder: Secondary | ICD-10-CM | POA: Diagnosis not present

## 2021-05-04 DIAGNOSIS — H1045 Other chronic allergic conjunctivitis: Secondary | ICD-10-CM | POA: Diagnosis not present

## 2021-05-04 DIAGNOSIS — E785 Hyperlipidemia, unspecified: Secondary | ICD-10-CM | POA: Diagnosis not present

## 2021-05-04 DIAGNOSIS — M519 Unspecified thoracic, thoracolumbar and lumbosacral intervertebral disc disorder: Secondary | ICD-10-CM | POA: Diagnosis not present

## 2021-05-04 DIAGNOSIS — J301 Allergic rhinitis due to pollen: Secondary | ICD-10-CM | POA: Diagnosis not present

## 2021-05-04 DIAGNOSIS — J3089 Other allergic rhinitis: Secondary | ICD-10-CM | POA: Diagnosis not present

## 2021-05-04 DIAGNOSIS — R739 Hyperglycemia, unspecified: Secondary | ICD-10-CM | POA: Diagnosis not present

## 2021-05-04 DIAGNOSIS — E034 Atrophy of thyroid (acquired): Secondary | ICD-10-CM | POA: Diagnosis not present

## 2021-05-04 DIAGNOSIS — Z91013 Allergy to seafood: Secondary | ICD-10-CM | POA: Diagnosis not present

## 2021-05-05 ENCOUNTER — Other Ambulatory Visit (INDEPENDENT_AMBULATORY_CARE_PROVIDER_SITE_OTHER): Payer: Self-pay | Admitting: Nurse Practitioner

## 2021-05-05 DIAGNOSIS — I83813 Varicose veins of bilateral lower extremities with pain: Secondary | ICD-10-CM

## 2021-05-07 NOTE — Progress Notes (Signed)
MRN : [id number]  Connie Glass is a 70 y.o. (Mar 18, 1951) female who presents with chief complaint of painful veins.  History of Present Illness: The patient is seen for evaluation of symptomatic varicose veins. The patient relates burning and stinging which worsened steadily throughout the course of the day, particularly with standing. The patient also notes an aching and throbbing pain over the varicosities, particularly with prolonged dependent positions. The symptoms are significantly improved with elevation.  The patient also notes that during hot weather the symptoms are greatly intensified. The patient states the pain from the varicose veins interferes with work, daily exercise, shopping and household maintenance. At this point, the symptoms are persistent and severe enough that they're having a negative impact on lifestyle and are interfering with daily activities.  There is a history of prior surgical intervention and sclerotherapy.  There is no history of DVT, PE or superficial thrombophlebitis. There is no history of ulceration or hemorrhage. The patient denies a significant family history of varicose veins.  The patient has worn graduated compression in the past. At the present time the patient has been using over-the-counter analgesics.    Duplex ultrasound of the venous system obtained today demonstrates normal deep system there is evidence for prior ablation/stripping of the great saphenous vein bilaterally.  In the left calf there is a small segment of saphenous vein with reflux.  Extensive varicosities are noted bilaterally.  No outpatient medications have been marked as taking for the 05/08/21 encounter (Appointment) with Delana Meyer, Dolores Lory, MD.    Past Medical History:  Diagnosis Date   Peripheral vascular disease [facility])     Past Surgical History:  Procedure Laterality Date   BREAST EXCISIONAL BIOPSY Left 2010   Benign     Social History Social History    Tobacco Use   Smoking status: Former   Smokeless tobacco: Former  Substance Use Topics   Alcohol use: Yes    Comment: social   Drug use: No    Family History No family history on file.  Allergies  Allergen Reactions   Other Anaphylaxis    Clams,crabs,whey,milk Mushrooms, bees   Shellfish Allergy      REVIEW OF SYSTEMS (Negative unless checked)  Constitutional: _0 Weight loss  _1 Fever  _2 Chills Cardiac: _3 Chest pain   _4 Chest pressure   _5 Palpitations   _6 Shortness of breath when laying flat   _7 Shortness of breath with exertion. Vascular:  _8 Pain in legs with walking   _9 Pain in legs at rest  _10 History of DVT   _11 Phlebitis   _12 Swelling in legs   _13 Varicose veins   _14 Non-healing ulcers Pulmonary:   _15 Uses home oxygen   _16 Productive cough   _17 Hemoptysis   _18 Wheeze  _19 COPD   _20 Asthma Neurologic:  _21 Dizziness   _22 Seizures   _23 History of stroke   _24 History of TIA  _25 Aphasia   _26 Vissual changes   _27 Weakness or numbness in arm   _28 Weakness or numbness in leg Musculoskeletal:   _29 Joint swelling   _30 Joint pain   _31 Low back pain Hematologic:  _32 Easy bruising  _33 Easy bleeding   _34 Hypercoagulable state   _35 Anemic Gastrointestinal:  _36 Diarrhea   _37 Vomiting  _38 Gastroesophageal reflux/heartburn   _39 Difficulty swallowing. Genitourinary:  _40 Chronic kidney disease   _41 Difficult urination  _42 Frequent urination   _43 Blood in urine Skin:  _44 Rashes   _45 Ulcers  Psychological:  _46 History of anxiety   _47  History of major depression.  Physical Examination  There were no vitals filed for this visit. There is no height or  weight on file to calculate BMI. Gen: WD/WN, NAD Head: [place]/AT, No temporalis wasting.  Ear/Nose/Throat: Hearing grossly intact, nares w/o erythema or drainage, pinna without lesions Eyes: PER, EOMI, sclera nonicteric.  Neck: Supple, no gross masses.  No JVD.  Pulmonary:  Good air movement, no audible wheezing, no use of accessory muscles.  Cardiac: RRR, precordium not  hyperdynamic. Vascular:  scattered large varicosities >10 mm present bilaterally.  Moderat venous stasis changes to the legs bilaterally.  2+ soft pitting edema  Vessel Right Left  Radial Palpable Palpable  Gastrointestinal: soft, non-distended. No guarding/no peritoneal signs.  Musculoskeletal: M/S 5/5 throughout.  No deformity.  Neurologic: CN 2-12 intact. Pain and light touch intact in extremities.  Symmetrical.  Speech is fluent. Motor exam as listed above. Psychiatric: Judgment intact, Mood & affect appropriate for pt's clinical situation. Dermatologic: Venous rashes no ulcers noted.  No changes consistent with cellulitis. Lymph : No lichenification or skin changes of chronic lymphedema.  CBC Lab Results  Component Value Date   WBC 5.7 05/04/2009   HGB 13.0 05/04/2009   HCT 38.1 05/04/2009   MCV 90.4 05/04/2009   PLT 269 05/04/2009    BMET    Component Value Date/Time   NA 139 05/04/2009 1310   K 3.9 05/04/2009 1310   CL 104 05/04/2009 1310   CO2 29 05/04/2009 1310   GLUCOSE 87 05/04/2009 1310   BUN 11 05/04/2009 1310   CREATININE 0.62 05/04/2009 1310   CALCIUM 8.8 05/04/2009 1310   GFRNONAA >60 05/04/2009 1310   GFRAA  05/04/2009 1310    >60        The eGFR has been calculated using the MDRD equation. This calculation has not been validated in all clinical situations. eGFR's persistently <60 mL/min signify possible Chronic Kidney Disease.   CrCl cannot be calculated (Patient's most recent lab result is older than the maximum 21 days allowed.).  COAG No results found for: INR, PROTIME  Radiology No results found.   Assessment/Plan 1. Varicose veins of both lower extremities with inflammation Recommend:  The patient has had successful ablation of the previously incompetent saphenous venous system but still has persistent symptoms of pain and swelling that are having a negative impact on daily life and daily activities.  Patient should undergo injection  sclerotherapy to treat the residual varicosities.  The risks, benefits and alternative therapies were reviewed in detail with the patient.  All questions were answered.  The patient agrees to proceed with sclerotherapy at their convenience.  The patient will continue wearing the graduated compression stockings and using the over-the-counter pain medications to treat her symptoms.    - VAS [place] LOWER EXTREMITY VENOUS REFLUX   2. Chronic venous insufficiency Recommend:  The patient has had successful ablation of the previously incompetent saphenous venous system but still has persistent symptoms of pain and swelling that are having a negative impact on daily life and daily activities.  Patient should undergo injection sclerotherapy to treat the residual varicosities.  The risks, benefits and alternative therapies were reviewed in detail with the patient.  All questions were answered.  The patient agrees to proceed with sclerotherapy at their convenience.  The patient will continue wearing the graduated compression stockings and using the over-the-counter pain medications to treat her symptoms.       3. Lymphedema No surgery or intervention at this point in time.  I have reviewed my discussion with the patient regarding venous insufficiency and why it causes symptoms. I have discussed with  the patient the chronic skin changes that accompany venous insufficiency and the long term sequela such as ulceration. Patient will contnue wearing graduated compression stockings on a daily basis, as this has provided excellent control of his edema. The patient will put the stockings on first thing in the morning and removing them in the evening. The patient is reminded not to sleep in the stockings.  In addition, behavioral modification including elevation during the day will be initiated. Exercise is strongly encouraged.   4. Pain in both lower extremities See #1-3     Hortencia Pilar,  MD  05/07/2021 8:25 PM

## 2021-05-08 ENCOUNTER — Ambulatory Visit (INDEPENDENT_AMBULATORY_CARE_PROVIDER_SITE_OTHER): Payer: PPO | Admitting: Vascular Surgery

## 2021-05-08 ENCOUNTER — Other Ambulatory Visit: Payer: Self-pay

## 2021-05-08 ENCOUNTER — Ambulatory Visit (INDEPENDENT_AMBULATORY_CARE_PROVIDER_SITE_OTHER): Payer: PPO

## 2021-05-08 VITALS — BP 151/91 | HR 71 | Ht 70.0 in | Wt 215.0 lb

## 2021-05-08 DIAGNOSIS — I83813 Varicose veins of bilateral lower extremities with pain: Secondary | ICD-10-CM | POA: Diagnosis not present

## 2021-05-08 DIAGNOSIS — R059 Cough, unspecified: Secondary | ICD-10-CM | POA: Insufficient documentation

## 2021-05-08 DIAGNOSIS — I8312 Varicose veins of left lower extremity with inflammation: Secondary | ICD-10-CM | POA: Diagnosis not present

## 2021-05-08 DIAGNOSIS — E039 Hypothyroidism, unspecified: Secondary | ICD-10-CM | POA: Insufficient documentation

## 2021-05-08 DIAGNOSIS — J301 Allergic rhinitis due to pollen: Secondary | ICD-10-CM | POA: Insufficient documentation

## 2021-05-08 DIAGNOSIS — I872 Venous insufficiency (chronic) (peripheral): Secondary | ICD-10-CM

## 2021-05-08 DIAGNOSIS — Z91013 Allergy to seafood: Secondary | ICD-10-CM | POA: Insufficient documentation

## 2021-05-08 DIAGNOSIS — M79605 Pain in left leg: Secondary | ICD-10-CM

## 2021-05-08 DIAGNOSIS — M79604 Pain in right leg: Secondary | ICD-10-CM | POA: Diagnosis not present

## 2021-05-08 DIAGNOSIS — I890 Lymphedema, not elsewhere classified: Secondary | ICD-10-CM | POA: Diagnosis not present

## 2021-05-08 DIAGNOSIS — I8311 Varicose veins of right lower extremity with inflammation: Secondary | ICD-10-CM

## 2021-05-08 DIAGNOSIS — H1045 Other chronic allergic conjunctivitis: Secondary | ICD-10-CM | POA: Insufficient documentation

## 2021-05-11 DIAGNOSIS — E034 Atrophy of thyroid (acquired): Secondary | ICD-10-CM | POA: Diagnosis not present

## 2021-05-11 DIAGNOSIS — E785 Hyperlipidemia, unspecified: Secondary | ICD-10-CM | POA: Diagnosis not present

## 2021-05-11 DIAGNOSIS — Z0000 Encounter for general adult medical examination without abnormal findings: Secondary | ICD-10-CM | POA: Diagnosis not present

## 2021-05-11 DIAGNOSIS — M519 Unspecified thoracic, thoracolumbar and lumbosacral intervertebral disc disorder: Secondary | ICD-10-CM | POA: Diagnosis not present

## 2021-05-11 DIAGNOSIS — Z23 Encounter for immunization: Secondary | ICD-10-CM | POA: Diagnosis not present

## 2021-05-11 DIAGNOSIS — R739 Hyperglycemia, unspecified: Secondary | ICD-10-CM | POA: Diagnosis not present

## 2021-05-11 DIAGNOSIS — Z1211 Encounter for screening for malignant neoplasm of colon: Secondary | ICD-10-CM | POA: Diagnosis not present

## 2021-05-11 DIAGNOSIS — R3121 Asymptomatic microscopic hematuria: Secondary | ICD-10-CM | POA: Diagnosis not present

## 2021-05-24 ENCOUNTER — Other Ambulatory Visit: Payer: Self-pay

## 2021-05-24 ENCOUNTER — Ambulatory Visit (INDEPENDENT_AMBULATORY_CARE_PROVIDER_SITE_OTHER): Payer: Self-pay | Admitting: Dermatology

## 2021-05-24 ENCOUNTER — Encounter: Payer: Self-pay | Admitting: Dermatology

## 2021-05-24 ENCOUNTER — Ambulatory Visit (INDEPENDENT_AMBULATORY_CARE_PROVIDER_SITE_OTHER): Payer: PPO | Admitting: Dermatology

## 2021-05-24 DIAGNOSIS — L720 Epidermal cyst: Secondary | ICD-10-CM | POA: Diagnosis not present

## 2021-05-24 DIAGNOSIS — Q828 Other specified congenital malformations of skin: Secondary | ICD-10-CM | POA: Diagnosis not present

## 2021-05-24 DIAGNOSIS — Z1283 Encounter for screening for malignant neoplasm of skin: Secondary | ICD-10-CM | POA: Diagnosis not present

## 2021-05-24 DIAGNOSIS — L578 Other skin changes due to chronic exposure to nonionizing radiation: Secondary | ICD-10-CM

## 2021-05-24 DIAGNOSIS — L988 Other specified disorders of the skin and subcutaneous tissue: Secondary | ICD-10-CM | POA: Diagnosis not present

## 2021-05-24 DIAGNOSIS — L814 Other melanin hyperpigmentation: Secondary | ICD-10-CM

## 2021-05-24 DIAGNOSIS — D1800 Hemangioma unspecified site: Secondary | ICD-10-CM

## 2021-05-24 DIAGNOSIS — I8393 Asymptomatic varicose veins of bilateral lower extremities: Secondary | ICD-10-CM

## 2021-05-24 DIAGNOSIS — L820 Inflamed seborrheic keratosis: Secondary | ICD-10-CM | POA: Diagnosis not present

## 2021-05-24 DIAGNOSIS — L853 Xerosis cutis: Secondary | ICD-10-CM | POA: Diagnosis not present

## 2021-05-24 DIAGNOSIS — D229 Melanocytic nevi, unspecified: Secondary | ICD-10-CM | POA: Diagnosis not present

## 2021-05-24 DIAGNOSIS — D485 Neoplasm of uncertain behavior of skin: Secondary | ICD-10-CM

## 2021-05-24 DIAGNOSIS — D225 Melanocytic nevi of trunk: Secondary | ICD-10-CM

## 2021-05-24 DIAGNOSIS — L821 Other seborrheic keratosis: Secondary | ICD-10-CM

## 2021-05-24 DIAGNOSIS — D1801 Hemangioma of skin and subcutaneous tissue: Secondary | ICD-10-CM | POA: Diagnosis not present

## 2021-05-24 MED ORDER — VALACYCLOVIR HCL 500 MG PO TABS: 500.0000 mg | ORAL_TABLET | Freq: Two times a day (BID) | ORAL | 0 refills | Status: AC

## 2021-05-24 NOTE — Progress Notes (Signed)
New Patient Visit  Subjective  Connie Glass is a 70 y.o. female who presents for the following: Skin Check.  Patient here for TBSE. She has a couple of spots on the face and posterior neck that will not go away. She has a spot on the right lower leg that she would like checked and removed, if possible. She also would like to discuss fillers of the nasolabial, oral commissure, and upper lip. She had Juvederm injections many years ago.  She also has a mole on her lower back and R breast that are irritating, which she would like removed.  The following portions of the chart were reviewed this encounter and updated as appropriate:       Review of Systems:  No other skin or systemic complaints except as noted in HPI or Assessment and Plan.  Objective  Well appearing patient in no apparent distress; mood and affect are within normal limits.  A full examination was performed including scalp, head, eyes, ears, nose, lips, neck, chest, axillae, abdomen, back, buttocks, bilateral upper extremities, bilateral lower extremities, hands, feet, fingers, toes, fingernails, and toenails. All findings within normal limits unless otherwise noted below.  oral commissure, nasolabial, upper lip Rhytides and volume loss.   back Excoriations of the back  R pretibia 3.0 cm light pink/brown patch with keratotic border  Left Lower Back 1.0 cm fleshy papule     Right Breast 7.74mm red papule     Right Upper Forehead x 1 Erythematous keratotic or waxy stuck-on papule  Posterior Neck 70mm pink firm sq nodule   Assessment & Plan  Skin cancer screening performed today.  Actinic Damage - chronic, secondary to cumulative UV radiation exposure/sun exposure over time - diffuse scaly erythematous macules with underlying dyspigmentation - Recommend daily broad spectrum sunscreen SPF 30+ to sun-exposed areas, reapply every 2 hours as needed.  - Recommend staying in the shade or wearing long  sleeves, sun glasses (UVA+UVB protection) and wide brim hats (4-inch brim around the entire circumference of the hat). - Call for new or changing lesions.  Hemangiomas - Red papules - Discussed benign nature - Observe - Call for any changes  Lentigines - Scattered tan macules, including left lower cheek at jaw - Due to sun exposure - Benign-appering, observe - Recommend daily broad spectrum sunscreen SPF 30+ to sun-exposed areas, reapply every 2 hours as needed. - Call for any changes - Discussed BBL laser treatments. Also discussed Rx fade creams.  May consider in the future. Pt will continue to use OTC Cetaphil dark spot removing cream  Melanocytic Nevi - Tan-brown and/or pink-flesh-colored symmetric macules and papules - Benign appearing on exam today - Observation - Call clinic for new or changing moles - Recommend daily use of broad spectrum spf 30+ sunscreen to sun-exposed areas.   Seborrheic Keratoses - Stuck-on, waxy, tan-brown papules and/or plaques  - Benign-appearing - Discussed benign etiology and prognosis. - Observe - Call for any changes  Varicose Veins/Spider Veins - Dilated blue, purple or red veins at the lower extremities - Reassured - Smaller vessels can be treated by sclerotherapy (a procedure to inject a medicine into the veins to make them disappear) if desired, but the treatment is not covered by insurance. Larger vessels may be covered if symptomatic and we would refer to vascular surgeon if treatment desired.   Elastosis of skin oral commissure, nasolabial, upper lip  Plan Restylane Defyne to the bilateral oral commissures and nasolabial, and Botox to the upper  lip. Patient has appt at 1:30pm today with Dr Nehemiah Massed for procedure.  Start Valtrex 500mg  take 1 po BID x 7 days #14 0Rf.  valACYclovir (VALTREX) 500 MG tablet - oral commissure, nasolabial, upper lip Take 1 tablet (500 mg total) by mouth 2 (two) times daily for 7 days. X 7  days  Xerosis cutis back  With pruritus.  Recommend mild soap and moisturizing cream 1-2 times daily.  Gentle skin care handout provided.      Porokeratosis R pretibia  Persistent benign lesion. Observe.  Difficult to treat/remove.  Often recurs  Start Cholesterol: 2% Lovastatin: 2% Cream Apply BID to AA dsp 60g 2Rf. Sent to [facility].   Neoplasm of uncertain behavior of skin (2) Left Lower Back  Epidermal / dermal shaving  Lesion diameter (cm):  1 Informed consent: discussed and consent obtained   Patient was prepped and draped in usual sterile fashion: Area prepped with alcohol. Anesthesia: the lesion was anesthetized in a standard fashion   Anesthetic:  1% lidocaine w/ epinephrine 1-100,000 buffered w/ 8.4% NaHCO3 Instrument used: flexible razor blade   Hemostasis achieved with: pressure, aluminum chloride and electrodesiccation   Outcome: patient tolerated procedure well   Post-procedure details: wound care instructions given   Post-procedure details comment:  Ointment and small bandage applied  Specimen 2 - Surgical pathology Differential Diagnosis: Irritated Nevus vs other Check Margins: No 1.0 cm fleshy papule  Right Breast  Epidermal / dermal shaving  Lesion diameter (cm):  0.9 Informed consent: discussed and consent obtained   Patient was prepped and draped in usual sterile fashion: Area prepped with alcohol. Anesthesia: the lesion was anesthetized in a standard fashion   Anesthetic:  1% lidocaine w/ epinephrine 1-100,000 buffered w/ 8.4% NaHCO3 Instrument used: flexible razor blade   Hemostasis achieved with: pressure, aluminum chloride and electrodesiccation   Outcome: patient tolerated procedure well   Post-procedure details: wound care instructions given   Post-procedure details comment:  Ointment and small bandage applied  Specimen 1 - Surgical pathology Differential Diagnosis: Irritated Hemangioma vs other Check Margins: No 7.28mm red  papule  Discussed resulting small scar with shave removal, and possible recurrence of lesion.  Recommend vaseline ointment to area daily and cover until healed.  Recommend photoprotection/sunscreen to area to prevent discoloration of scar.  Once healed, may apply OTC Serica scar gel bid to thickened scars.   Inflamed seborrheic keratosis Right Upper Forehead x 1  Destruction of lesion - Right Upper Forehead x 1  Destruction method: cryotherapy   Informed consent: discussed and consent obtained   Lesion destroyed using liquid nitrogen: Yes   Region frozen until ice ball extended beyond lesion: Yes   Outcome: patient tolerated procedure well with no complications   Post-procedure details: wound care instructions given   Additional details:  Prior to procedure, discussed risks of blister formation, small wound, skin dyspigmentation, or rare scar following cryotherapy. Recommend Vaseline ointment to treated areas while healing.   Epidermal inclusion cyst Posterior Neck  Benign-appearing. Exam most consistent with an epidermal inclusion cyst. Discussed that a cyst is a benign growth that can grow over time and sometimes get irritated or inflamed. Recommend observation if it is not bothersome. Discussed option of surgical excision to remove it if it is growing, symptomatic, or other changes noted.  Cyst with symptoms and/or recent change.  Discussed surgical excision to remove, including resulting scar and possible recurrence.  Patient will schedule for surgery. Pre-op information given.    Return for cyst  excision posterior neck.  IJamesetta Orleans, CMA, am acting as scribe for Brendolyn Patty, MD .  Documentation: I have reviewed the above documentation for accuracy and completeness, and I agree with the above.  Brendolyn Patty MD

## 2021-05-24 NOTE — Progress Notes (Signed)
   Follow-Up Visit   Subjective  Connie Glass is a 70 y.o. female who presents for the following: Facial Elastosis (Patient is here today for Botox and fillers).  The following portions of the chart were reviewed this encounter and updated as appropriate:   Tobacco  Allergies  Meds  Problems  Med Hx  Surg Hx  Fam Hx     Review of Systems:  No other skin or systemic complaints except as noted in HPI or Assessment and Plan.  Objective  Well appearing patient in no apparent distress; mood and affect are within normal limits.  A focused examination was performed including the face. Relevant physical exam findings are noted in the Assessment and Plan.  Face Rhytides and volume loss.                           Assessment & Plan  Elastosis of skin Face  Botox Injection - Face Location: See attached image  Informed consent: Discussed risks (infection, pain, bleeding, bruising, swelling, allergic reaction, paralysis of nearby muscles, eyelid droop, double vision, neck weakness, difficulty breathing, headache, undesirable cosmetic result, and need for additional treatment) and benefits of the procedure, as well as the alternatives.  Informed consent was obtained.  Preparation: The area was cleansed with alcohol.  Procedure Details:  Botox was injected into the dermis with a 30-gauge needle. Pressure applied to any bleeding. Ice packs offered for swelling.  Lot Number:  [id number] Expiration:  02/2023  Total Units Injected:  29 25 units of the frown complex 4 units of the upper lip Plan: Patient was instructed to remain upright for 4 hours. Patient was instructed to avoid massaging the face and avoid vigorous exercise for the rest of the day. Tylenol may be used for headache.  Allow 2 weeks before returning to clinic for additional dosing as needed. Patient will call for any problems.   Filling material injection - Face Prior to the procedure, the  patient's past medical history, allergies and the rare but potential risks and complications were reviewed with the patient and a signed consent was obtained. Pre and post-treatment care was discussed and instructions provided.  Location: perioral and nasolabial folds; oral commissures  Filler Type: Restylane defyne  Lot # [id number] Expiration date 04/05/2022  Procedure: The area was prepped thoroughly with Puracyn. After introducing the needle into the desired treatment area, the syringe plunger was drawn back to ensure there was no flash of blood prior to injecting the filler in order to minimize risk of intravascular injection and vascular occlusion. After injection of the filler, the treated areas were cleansed and iced to reduce swelling. Post-treatment instructions were reviewed with the patient.       Patient tolerated the procedure well. The patient will call with any problems, questions or concerns prior to their next appointment.   Related Medications valACYclovir (VALTREX) 500 MG tablet Take 1 tablet (500 mg total) by mouth 2 (two) times daily for 7 days. X 7 days  Return for Fillers and Botox in 3-4 weeks.  Luther Redo, CMA, am acting as scribe for Sarina Ser, MD . Documentation: I have reviewed the above documentation for accuracy and completeness, and I agree with the above.  Sarina Ser, MD

## 2021-05-24 NOTE — Patient Instructions (Signed)

## 2021-05-24 NOTE — Patient Instructions (Addendum)
Instructions for [facility] Medications  One or more of your medications was sent to the [facility] mail order compounding pharmacy. You will receive an email from them and can purchase the medicine through that link. It will then be mailed to your home at the address you confirmed. If for any reason you do not receive an email from them, please check your spam folder. If you still do not find the email, please let us know. [facility] phone number is [phone].    Gentle Skin Care Guide  1. Bathe no more than once a day.  2. Avoid bathing in hot water  3. Use a mild soap like Dove, Vanicream, Cetaphil, CeraVe. Can use Lever 2000 or Cetaphil antibacterial soap  4. Use soap only where you need it. On most days, use it under your arms, between your legs, and on your feet. Let the water rinse other areas unless visibly dirty.  5. When you get out of the bath/shower, use a towel to gently blot your skin dry, don't rub it.  6. While your skin is still a little damp, apply a moisturizing cream such as Vanicream, CeraVe, Cetaphil, Eucerin, Sarna lotion or plain Vaseline Jelly. For hands apply Neutrogena [place] Hand Cream or Excipial Hand Cream.  7. Reapply moisturizer any time you start to itch or feel dry.  8. Sometimes using free and clear laundry detergents can be helpful. Fabric softener sheets should be avoided. Downy Free & Gentle liquid, or any liquid fabric softener that is free of dyes and perfumes, it acceptable to use  9. If your doctor has given you prescription creams you may apply moisturizers over them    Cryotherapy Aftercare  Wash gently with soap and water everyday.   Apply Vaseline and Band-Aid daily until healed.        Pre-Operative Instructions  You are scheduled for a surgical procedure at [facility]. We recommend you read the following instructions. If you have any questions or concerns, please call the office at  [phone].  Shower and wash the entire body with soap and water the day of your surgery paying special attention to cleansing at and around the planned surgery site.  Avoid aspirin or aspirin containing products at least fourteen (14) days prior to your surgical procedure and for at least one week (7 Days) after your surgical procedure. If you take aspirin on a regular basis for heart disease or history of stroke or for any other reason, we may recommend you continue taking aspirin but please notify us if you take this on a regular basis. Aspirin can cause more bleeding to occur during surgery as well as prolonged bleeding and bruising after surgery.   Avoid other nonsteroidal pain medications at least one week prior to surgery and at least one week prior to your surgery. These include medications such as Ibuprofen (Motrin, Advil and Nuprin), Naprosyn, Voltaren, Relafen, etc. If medications are used for therapeutic reasons, please inform us as they can cause increased bleeding or prolonged bleeding during and bruising after surgical procedures.   Please advise [place] if you are taking any "blood thinner" medications such as Coumadin or Dipyridamole or Plavix or similar medications. These cause increased bleeding and prolonged bleeding during procedures and bruising after surgical procedures. We may have to consider discontinuing these medications briefly prior to and shortly after your surgery if safe to do so.   Please inform us of all medications you are currently taking. All medications that are  taken regularly should be taken the day of surgery as you always do. Nevertheless, we need to be informed of what medications you are taking prior to surgery to know whether they will affect the procedure or cause any complications.   Please inform us of any medication allergies. Also inform us of whether you have allergies to Latex or rubber products or whether you have had any adverse reaction to Lidocaine or  Epinephrine.  Please inform us of any prosthetic or artificial body parts such as artificial heart valve, joint replacements, etc., or similar condition that might require preoperative antibiotics.   We recommend avoidance of alcohol at least two weeks prior to surgery and continued avoidance for at least two weeks after surgery.   We recommend discontinuation of tobacco smoking at least two weeks prior to surgery and continued abstinence for at least two weeks after surgery.  Do not plan strenuous exercise, strenuous work or strenuous lifting for approximately four weeks after your surgery.   We request if you are unable to make your scheduled surgical appointment, please call us at least a week in advance or as soon as you are aware of a problem so that we can cancel or reschedule the appointment.   You MAY TAKE TYLENOL (acetaminophen) for pain as it is not a blood thinner.   PLEASE PLAN TO BE IN TOWN FOR TWO WEEKS FOLLOWING SURGERY, THIS IS IMPORTANT SO YOU CAN BE CHECKED FOR DRESSING CHANGES, SUTURE REMOVAL AND TO MONITOR FOR POSSIBLE COMPLICATIONS. Wound Care Instructions  Cleanse wound gently with soap and water once a day then pat dry with clean gauze. Apply a thing coat of Petrolatum (petroleum jelly, "Vaseline") over the wound (unless you have an allergy to this). We recommend that you use a new, sterile tube of Vaseline. Do not pick or remove scabs. Do not remove the yellow or white "healing tissue" from the base of the wound.  Cover the wound with fresh, clean, nonstick gauze and secure with paper tape. You may use Band-Aids in place of gauze and tape if the would is small enough, but would recommend trimming much of the tape off as there is often too much. Sometimes Band-Aids can irritate the skin.  You should call the office for your biopsy report after 1 week if you have not already been contacted.  If you experience any problems, such as abnormal amounts of bleeding, swelling,  significant bruising, significant pain, or evidence of infection, please call the office immediately.  FOR ADULT SURGERY PATIENTS: If you need something for pain relief you may take 1 extra strength Tylenol (acetaminophen) AND 2 Ibuprofen (200mg  each) together every 4 hours as needed for pain. (do not take these if you are allergic to them or if you have a reason you should not take them.) Typically, you may only need pain medication for 1 to 3 days.

## 2021-05-29 ENCOUNTER — Telehealth: Payer: Self-pay

## 2021-05-29 DIAGNOSIS — Z1211 Encounter for screening for malignant neoplasm of colon: Secondary | ICD-10-CM | POA: Diagnosis not present

## 2021-05-29 NOTE — Telephone Encounter (Signed)
-----   Message from Brendolyn Patty, MD sent at 05/29/2021 12:17 PM EDT ----- 1. Skin , right breast HEMANGIOMA, BASE INVOLVED 2. Skin , left lower back MELANOCYTIC NEVUS, INTRADERMAL TYPE, BASE INVOLVED   1 Benign "blood mole"  may recur 2.Benign mole may recur  - please call patient

## 2021-05-29 NOTE — Telephone Encounter (Signed)
Advised patient biopsies were both benign.

## 2021-06-02 LAB — COLOGUARD: COLOGUARD: POSITIVE — AB

## 2021-06-13 ENCOUNTER — Ambulatory Visit: Payer: PPO | Admitting: Dermatology

## 2021-06-21 ENCOUNTER — Ambulatory Visit: Payer: PPO | Admitting: Dermatology

## 2021-09-08 DIAGNOSIS — R7301 Impaired fasting glucose: Secondary | ICD-10-CM | POA: Diagnosis not present

## 2021-09-08 DIAGNOSIS — E890 Postprocedural hypothyroidism: Secondary | ICD-10-CM | POA: Diagnosis not present

## 2021-09-15 DIAGNOSIS — E6609 Other obesity due to excess calories: Secondary | ICD-10-CM | POA: Diagnosis not present

## 2021-09-15 DIAGNOSIS — E049 Nontoxic goiter, unspecified: Secondary | ICD-10-CM | POA: Diagnosis not present

## 2021-09-15 DIAGNOSIS — R635 Abnormal weight gain: Secondary | ICD-10-CM | POA: Diagnosis not present

## 2021-09-15 DIAGNOSIS — E890 Postprocedural hypothyroidism: Secondary | ICD-10-CM | POA: Diagnosis not present

## 2021-09-15 DIAGNOSIS — R7301 Impaired fasting glucose: Secondary | ICD-10-CM | POA: Diagnosis not present

## 2021-09-15 DIAGNOSIS — E7800 Pure hypercholesterolemia, unspecified: Secondary | ICD-10-CM | POA: Diagnosis not present

## 2021-10-02 DIAGNOSIS — R195 Other fecal abnormalities: Secondary | ICD-10-CM | POA: Diagnosis not present

## 2021-11-07 DIAGNOSIS — R3121 Asymptomatic microscopic hematuria: Secondary | ICD-10-CM | POA: Diagnosis not present

## 2021-11-07 DIAGNOSIS — R739 Hyperglycemia, unspecified: Secondary | ICD-10-CM | POA: Diagnosis not present

## 2021-11-07 DIAGNOSIS — M519 Unspecified thoracic, thoracolumbar and lumbosacral intervertebral disc disorder: Secondary | ICD-10-CM | POA: Diagnosis not present

## 2021-11-07 DIAGNOSIS — E034 Atrophy of thyroid (acquired): Secondary | ICD-10-CM | POA: Diagnosis not present

## 2021-11-07 DIAGNOSIS — E785 Hyperlipidemia, unspecified: Secondary | ICD-10-CM | POA: Diagnosis not present

## 2021-11-14 DIAGNOSIS — M519 Unspecified thoracic, thoracolumbar and lumbosacral intervertebral disc disorder: Secondary | ICD-10-CM | POA: Diagnosis not present

## 2021-11-14 DIAGNOSIS — E785 Hyperlipidemia, unspecified: Secondary | ICD-10-CM | POA: Diagnosis not present

## 2021-11-14 DIAGNOSIS — R739 Hyperglycemia, unspecified: Secondary | ICD-10-CM | POA: Diagnosis not present

## 2021-11-14 DIAGNOSIS — E034 Atrophy of thyroid (acquired): Secondary | ICD-10-CM | POA: Diagnosis not present

## 2021-11-14 DIAGNOSIS — J309 Allergic rhinitis, unspecified: Secondary | ICD-10-CM | POA: Diagnosis not present

## 2021-11-14 DIAGNOSIS — R3121 Asymptomatic microscopic hematuria: Secondary | ICD-10-CM | POA: Diagnosis not present

## 2021-11-21 ENCOUNTER — Other Ambulatory Visit: Payer: Self-pay | Admitting: Internal Medicine

## 2021-11-21 DIAGNOSIS — Z1231 Encounter for screening mammogram for malignant neoplasm of breast: Secondary | ICD-10-CM

## 2021-12-01 DIAGNOSIS — K5730 Diverticulosis of large intestine without perforation or abscess without bleeding: Secondary | ICD-10-CM | POA: Diagnosis not present

## 2021-12-01 DIAGNOSIS — R195 Other fecal abnormalities: Secondary | ICD-10-CM | POA: Diagnosis not present

## 2021-12-01 DIAGNOSIS — Z1211 Encounter for screening for malignant neoplasm of colon: Secondary | ICD-10-CM | POA: Diagnosis not present

## 2021-12-01 DIAGNOSIS — K640 First degree hemorrhoids: Secondary | ICD-10-CM | POA: Diagnosis not present

## 2021-12-27 ENCOUNTER — Ambulatory Visit: Payer: PPO

## 2022-01-22 ENCOUNTER — Ambulatory Visit: Payer: PPO

## 2022-01-26 ENCOUNTER — Ambulatory Visit: Payer: PPO

## 2022-01-31 ENCOUNTER — Ambulatory Visit: Payer: PPO

## 2022-02-20 ENCOUNTER — Ambulatory Visit
Admission: RE | Admit: 2022-02-20 | Discharge: 2022-02-20 | Disposition: A | Discharge status: Home / Self Care | Payer: PPO | Source: Ambulatory Visit | Attending: Internal Medicine | Admitting: Internal Medicine

## 2022-02-20 DIAGNOSIS — Z1231 Encounter for screening mammogram for malignant neoplasm of breast: Secondary | ICD-10-CM

## 2022-02-22 DIAGNOSIS — H26493 Other secondary cataract, bilateral: Secondary | ICD-10-CM | POA: Diagnosis not present

## 2022-02-22 DIAGNOSIS — H40053 Ocular hypertension, bilateral: Secondary | ICD-10-CM | POA: Diagnosis not present

## 2022-02-22 DIAGNOSIS — H02831 Dermatochalasis of right upper eyelid: Secondary | ICD-10-CM | POA: Diagnosis not present

## 2022-02-22 DIAGNOSIS — H35363 Drusen (degenerative) of macula, bilateral: Secondary | ICD-10-CM | POA: Diagnosis not present

## 2022-04-25 ENCOUNTER — Ambulatory Visit (INDEPENDENT_AMBULATORY_CARE_PROVIDER_SITE_OTHER): Payer: PPO | Admitting: Dermatology

## 2022-04-25 DIAGNOSIS — L821 Other seborrheic keratosis: Secondary | ICD-10-CM | POA: Diagnosis not present

## 2022-04-25 DIAGNOSIS — L720 Epidermal cyst: Secondary | ICD-10-CM

## 2022-04-25 DIAGNOSIS — Q828 Other specified congenital malformations of skin: Secondary | ICD-10-CM | POA: Diagnosis not present

## 2022-04-25 DIAGNOSIS — D485 Neoplasm of uncertain behavior of skin: Secondary | ICD-10-CM

## 2022-04-25 DIAGNOSIS — L570 Actinic keratosis: Secondary | ICD-10-CM

## 2022-04-25 DIAGNOSIS — L814 Other melanin hyperpigmentation: Secondary | ICD-10-CM

## 2022-04-25 DIAGNOSIS — I8393 Asymptomatic varicose veins of bilateral lower extremities: Secondary | ICD-10-CM

## 2022-04-25 DIAGNOSIS — C4491 Basal cell carcinoma of skin, unspecified: Secondary | ICD-10-CM

## 2022-04-25 DIAGNOSIS — D225 Melanocytic nevi of trunk: Secondary | ICD-10-CM

## 2022-04-25 DIAGNOSIS — L578 Other skin changes due to chronic exposure to nonionizing radiation: Secondary | ICD-10-CM

## 2022-04-25 DIAGNOSIS — Z1283 Encounter for screening for malignant neoplasm of skin: Secondary | ICD-10-CM

## 2022-04-25 DIAGNOSIS — L988 Other specified disorders of the skin and subcutaneous tissue: Secondary | ICD-10-CM

## 2022-04-25 DIAGNOSIS — C44311 Basal cell carcinoma of skin of nose: Secondary | ICD-10-CM

## 2022-04-25 HISTORY — DX: Basal cell carcinoma of skin, unspecified: C44.91

## 2022-04-25 NOTE — Patient Instructions (Signed)
Cryotherapy Aftercare  Wash gently with soap and water everyday.   Apply Vaseline and Band-Aid daily until healed.    Wound Care Instructions  Cleanse wound gently with soap and water once a day then pat dry with clean gauze. Apply a thin coat of Petrolatum (petroleum jelly, "Vaseline") over the wound (unless you have an allergy to this). We recommend that you use a new, sterile tube of Vaseline. Do not pick or remove scabs. Do not remove the yellow or white "healing tissue" from the base of the wound.  Cover the wound with fresh, clean, nonstick gauze and secure with paper tape. You may use Band-Aids in place of gauze and tape if the wound is small enough, but would recommend trimming much of the tape off as there is often too much. Sometimes Band-Aids can irritate the skin.  You should call the office for your biopsy report after 1 week if you have not already been contacted.  If you experience any problems, such as abnormal amounts of bleeding, swelling, significant bruising, significant pain, or evidence of infection, please call the office immediately.  FOR ADULT SURGERY PATIENTS: If you need something for pain relief you may take 1 extra strength Tylenol (acetaminophen) AND 2 Ibuprofen (200mg each) together every 4 hours as needed for pain. (do not take these if you are allergic to them or if you have a reason you should not take them.) Typically, you may only need pain medication for 1 to 3 days.    

## 2022-04-25 NOTE — Progress Notes (Signed)
Follow-Up Visit   Subjective  Connie Glass is a 71 y.o. female who presents for the following: Annual Exam.  The patient presents for Total-Body Skin Exam (TBSE) for skin cancer screening and mole check.  The patient has spots, moles and lesions to be evaluated, some may be new or changing. She has a couple of spots to check on the left temple and right paranasal.    The following portions of the chart were reviewed this encounter and updated as appropriate:       Review of Systems:  No other skin or systemic complaints except as noted in HPI or Assessment and Plan.  Objective  Well appearing patient in no apparent distress; mood and affect are within normal limits.  A full examination was performed including scalp, head, eyes, ears, nose, lips, neck, chest, axillae, abdomen, back, buttocks, bilateral upper extremities, bilateral lower extremities, hands, feet, fingers, toes, fingernails, and toenails. All findings within normal limits unless otherwise noted below.  R lat forehead x 2, L temple x 1 (3) Pink scaly macules.  R paranasal 0.8 x 0.6cm firm flesh papule, not visible on previous photos from 1 yr ago     spinal mid upper back 0.6cm flesh colored papule  L posterior neck 0.8 cm firm sq nodule  right pretibia 3.0 cm light pink/brown patch with keratotic border, with flattening  bilateral nasolabial; bilateral oral commissure Rhytides and volume loss.     Assessment & Plan  Skin cancer screening performed today.  Actinic Damage - chronic, secondary to cumulative UV radiation exposure/sun exposure over time - diffuse scaly erythematous macules with underlying dyspigmentation - Recommend daily broad spectrum sunscreen SPF 30+ to sun-exposed areas, reapply every 2 hours as needed.  - Recommend staying in the shade or wearing long sleeves, sun glasses (UVA+UVB protection) and wide brim hats (4-inch brim around the entire circumference of the hat). - Call for  new or changing lesions.  Lentigines - Scattered tan macules - Due to sun exposure - Benign-appearing, observe - Recommend daily broad spectrum sunscreen SPF 30+ to sun-exposed areas, reapply every 2 hours as needed. - Call for any changes  Seborrheic Keratoses - Stuck-on, waxy, tan-brown papules and/or plaques  - Benign-appearing - Discussed benign etiology and prognosis. - Observe - Call for any changes  Varicose Veins/Spider Veins - Dilated blue, purple or red veins at the lower extremities - Reassured - Smaller vessels can be treated by sclerotherapy (a procedure to inject a medicine into the veins to make them disappear) if desired, but the treatment is not covered by insurance. Larger vessels may be covered if symptomatic and we would refer to vascular surgeon if treatment desired   AK (actinic keratosis) (3) R lat forehead x 2, L temple x 1  Actinic keratoses are precancerous spots that appear secondary to cumulative UV radiation exposure/sun exposure over time. They are chronic with expected duration over 1 year. A portion of actinic keratoses will progress to squamous cell carcinoma of the skin. It is not possible to reliably predict which spots will progress to skin cancer and so treatment is recommended to prevent development of skin cancer.  Recommend daily broad spectrum sunscreen SPF 30+ to sun-exposed areas, reapply every 2 hours as needed.  Recommend staying in the shade or wearing long sleeves, sun glasses (UVA+UVB protection) and wide brim hats (4-inch brim around the entire circumference of the hat). Call for new or changing lesions.  Destruction of lesion - R lat forehead x  2, L temple x 1  Destruction method: cryotherapy   Informed consent: discussed and consent obtained   Lesion destroyed using liquid nitrogen: Yes   Region frozen until ice ball extended beyond lesion: Yes   Outcome: patient tolerated procedure well with no complications   Post-procedure  details: wound care instructions given   Additional details:  Prior to procedure, discussed risks of blister formation, small wound, skin dyspigmentation, or rare scar following cryotherapy. Recommend Vaseline ointment to treated areas while healing.   Neoplasm of uncertain behavior of skin (2) R paranasal  Epidermal / dermal shaving  Lesion diameter (cm):  0.8 Informed consent: discussed and consent obtained   Patient was prepped and draped in usual sterile fashion: Area prepped with alcohol. Anesthesia: the lesion was anesthetized in a standard fashion   Anesthetic:  1% lidocaine w/ epinephrine 1-100,000 buffered w/ 8.4% NaHCO3 Instrument used: flexible razor blade   Hemostasis achieved with: pressure, aluminum chloride and electrodesiccation   Outcome: patient tolerated procedure well   Post-procedure details: wound care instructions given   Post-procedure details comment:  Ointment and small bandage applied  Specimen 2 - Surgical pathology Differential Diagnosis: Sebaceous Hyperplasia/Cyst vs Nevus r/o BCC Check Margins: No 0.8 x 0.6cm firm flesh papule  spinal mid upper back  Epidermal / dermal shaving  Lesion diameter (cm):  0.6 Informed consent: discussed and consent obtained   Patient was prepped and draped in usual sterile fashion: Area prepped with alcohol. Anesthesia: the lesion was anesthetized in a standard fashion   Anesthetic:  1% lidocaine w/ epinephrine 1-100,000 buffered w/ 8.4% NaHCO3 Instrument used: flexible razor blade   Hemostasis achieved with: pressure, aluminum chloride and electrodesiccation   Outcome: patient tolerated procedure well   Post-procedure details: wound care instructions given   Post-procedure details comment:  Ointment and small bandage applied  Specimen 1 - Surgical pathology Differential Diagnosis: Irritated Nevus vs other Check Margins: No 0.6cm flesh colored papule  R paranasal- Sebaceous hyperplasia vrs nevus r/o BCC Rec.  Mohs surgery if BCC  Epidermal cyst L posterior neck  Cyst with symptoms and/or recent change.  Discussed surgical excision to remove, including resulting scar and possible recurrence.  Patient will schedule for surgery. Pre-op information given.   Porokeratosis right pretibia  Persistent benign lesion. Observe.  Difficult to treat/remove.  Often recurs. Improving with topical cream.   Continue Cholesterol: 2% Lovastatin: 2% Cream Apply BID to AA. Will call for refills.   Elastosis of skin bilateral nasolabial; bilateral oral commissure  Plan Restylane Defyne x 2 to the bilateral oral commissures and nasolabial on follow-up.   Return for Fillers.  IJamesetta Orleans, CMA, am acting as scribe for Brendolyn Patty, MD .  Documentation: I have reviewed the above documentation for accuracy and completeness, and I agree with the above.  Brendolyn Patty MD

## 2022-04-30 ENCOUNTER — Other Ambulatory Visit: Payer: Self-pay

## 2022-04-30 ENCOUNTER — Telehealth: Payer: Self-pay

## 2022-04-30 DIAGNOSIS — C44319 Basal cell carcinoma of skin of other parts of face: Secondary | ICD-10-CM

## 2022-04-30 NOTE — Telephone Encounter (Signed)
Advised pt of bx results.  Discussed referral for mohs.  Pt wanted to talk to her neighbor who just had mohs and [facility] and would like to be referred to that doctor.  She will call us back with the name of the doctor so we can send referral in.  Discussed scheduling pt for excison of cyst on neck and she would like to have [place] treated before she schedules the cyst surgery.Mariana Kaufman

## 2022-04-30 NOTE — Telephone Encounter (Signed)
-----   Message from Brendolyn Patty, MD sent at 04/30/2022  8:30 AM EDT ----- 1. Skin , spinal mid upper back MELANOCYTIC NEVUS, INTRADERMAL TYPE, IRRITATED 2. Skin , right paranasal BASAL CELL CARCINOMA, NODULAR PATTERN - please call patient  1. Benign irritated mole 2. BCC skin cancer- needs Mohs surgery, [facility] or [facility]

## 2022-04-30 NOTE — Progress Notes (Signed)
Referral to Dr. Lacinda Axon for mohs [facility] of R para nasal/sh

## 2022-05-09 DIAGNOSIS — R739 Hyperglycemia, unspecified: Secondary | ICD-10-CM | POA: Diagnosis not present

## 2022-05-09 DIAGNOSIS — R3121 Asymptomatic microscopic hematuria: Secondary | ICD-10-CM | POA: Diagnosis not present

## 2022-05-09 DIAGNOSIS — E785 Hyperlipidemia, unspecified: Secondary | ICD-10-CM | POA: Diagnosis not present

## 2022-05-09 DIAGNOSIS — E034 Atrophy of thyroid (acquired): Secondary | ICD-10-CM | POA: Diagnosis not present

## 2022-05-09 DIAGNOSIS — M519 Unspecified thoracic, thoracolumbar and lumbosacral intervertebral disc disorder: Secondary | ICD-10-CM | POA: Diagnosis not present

## 2022-05-16 DIAGNOSIS — Z0000 Encounter for general adult medical examination without abnormal findings: Secondary | ICD-10-CM | POA: Diagnosis not present

## 2022-05-16 DIAGNOSIS — E034 Atrophy of thyroid (acquired): Secondary | ICD-10-CM | POA: Diagnosis not present

## 2022-05-16 DIAGNOSIS — M519 Unspecified thoracic, thoracolumbar and lumbosacral intervertebral disc disorder: Secondary | ICD-10-CM | POA: Diagnosis not present

## 2022-05-16 DIAGNOSIS — Z23 Encounter for immunization: Secondary | ICD-10-CM | POA: Diagnosis not present

## 2022-05-16 DIAGNOSIS — R7303 Prediabetes: Secondary | ICD-10-CM | POA: Diagnosis not present

## 2022-05-16 DIAGNOSIS — E785 Hyperlipidemia, unspecified: Secondary | ICD-10-CM | POA: Diagnosis not present

## 2022-05-16 DIAGNOSIS — R3121 Asymptomatic microscopic hematuria: Secondary | ICD-10-CM | POA: Diagnosis not present

## 2022-06-04 ENCOUNTER — Encounter (INDEPENDENT_AMBULATORY_CARE_PROVIDER_SITE_OTHER): Payer: Self-pay

## 2022-06-11 ENCOUNTER — Ambulatory Visit: Payer: PPO | Admitting: Dermatology

## 2022-06-12 ENCOUNTER — Ambulatory Visit (INDEPENDENT_AMBULATORY_CARE_PROVIDER_SITE_OTHER): Payer: PPO | Admitting: Dermatology

## 2022-06-12 DIAGNOSIS — C44311 Basal cell carcinoma of skin of nose: Secondary | ICD-10-CM | POA: Diagnosis not present

## 2022-06-12 DIAGNOSIS — L820 Inflamed seborrheic keratosis: Secondary | ICD-10-CM

## 2022-06-12 NOTE — Progress Notes (Signed)
   Follow-Up Visit   Subjective  Connie Glass is a 71 y.o. female who presents for the following: Skin Problem (Patient here today concerning a spot at right upper eyelid she would like removed. ).  The patient has spots, moles and lesions to be evaluated, some may be new or changing and the patient has concerns that these could be cancer.   The following portions of the chart were reviewed this encounter and updated as appropriate:      Review of Systems: No other skin or systemic complaints except as noted in HPI or Assessment and Plan.   Objective  Well appearing patient in no apparent distress; mood and affect are within normal limits.  A focused examination was performed including right upper eyelid. Relevant physical exam findings are noted in the Assessment and Plan.  Right Upper Eyelid 2 mm waxy tan pedunculated papule with crusting   Assessment & Plan  Inflamed seborrheic keratosis Right Upper Eyelid  Symptomatic, irritating, patient would like treated  Epidermal / dermal shaving - Right Upper Eyelid  Lesion diameter (cm):  0.2 Informed consent: discussed and consent obtained   Anesthesia: the lesion was anesthetized in a standard fashion   Anesthetic:  1% lidocaine w/ epinephrine 1-100,000 buffered w/ 8.4% NaHCO3 Instrument used: scissors   Hemostasis achieved with: pressure and electrodesiccation   Outcome: patient tolerated procedure well   Post-procedure details: wound care instructions given   Additional details:  Mupirocin ointment applied    Basal Cell Carcinoma of the Skin - Scheduled for Mohs surgery with Dr. Lacinda Axon in several weeks R paranasal. - Recommend regular full body skin exams - Recommend daily broad spectrum sunscreen SPF 30+ to sun-exposed areas, reapply every 2 hours as needed.  - Call if any new or changing lesions are noted between office visits    Return for as scheduled, surgery appt for cyst removal post neck. I, Ruthell Rummage,  CMA, am acting as scribe for Brendolyn Patty, MD.  Documentation: I have reviewed the above documentation for accuracy and completeness, and I agree with the above.  Brendolyn Patty MD

## 2022-06-12 NOTE — Patient Instructions (Addendum)
Wound Care Instructions  Cleanse wound gently with soap and water once a day then pat dry with clean gauze. Apply a thin coat of Petrolatum (petroleum jelly, "Vaseline") over the wound (unless you have an allergy to this). We recommend that you use a new, sterile tube of Vaseline. Do not pick or remove scabs. Do not remove the yellow or white "healing tissue" from the base of the wound.  Cover the wound with fresh, clean, nonstick gauze and secure with paper tape. You may use Band-Aids in place of gauze and tape if the wound is small enough, but would recommend trimming much of the tape off as there is often too much. Sometimes Band-Aids can irritate the skin.  You should call the office for your biopsy report after 1 week if you have not already been contacted.  If you experience any problems, such as abnormal amounts of bleeding, swelling, significant bruising, significant pain, or evidence of infection, please call the office immediately.      Pre-Operative Instructions  You are scheduled for a surgical procedure at [facility]. We recommend you read the following instructions. If you have any questions or concerns, please call the office at [phone].  Shower and wash the entire body with soap and water the day of your surgery paying special attention to cleansing at and around the planned surgery site.  Avoid aspirin or aspirin containing products at least fourteen (14) days prior to your surgical procedure and for at least one week (7 Days) after your surgical procedure. If you take aspirin on a regular basis for heart disease or history of stroke or for any other reason, we may recommend you continue taking aspirin but please notify us if you take this on a regular basis. Aspirin can cause more bleeding to occur during surgery as well as prolonged bleeding and bruising after surgery.   Avoid other nonsteroidal pain medications at least one week prior to surgery and at least  one week prior to your surgery. These include medications such as Ibuprofen (Motrin, Advil and Nuprin), Naprosyn, Voltaren, Relafen, etc. If medications are used for therapeutic reasons, please inform us as they can cause increased bleeding or prolonged bleeding during and bruising after surgical procedures.   Please advise [place] if you are taking any "blood thinner" medications such as Coumadin or Dipyridamole or Plavix or similar medications. These cause increased bleeding and prolonged bleeding during procedures and bruising after surgical procedures. We may have to consider discontinuing these medications briefly prior to and shortly after your surgery if safe to do so.   Please inform us of all medications you are currently taking. All medications that are taken regularly should be taken the day of surgery as you always do. Nevertheless, we need to be informed of what medications you are taking prior to surgery to know whether they will affect the procedure or cause any complications.   Please inform us of any medication allergies. Also inform us of whether you have allergies to Latex or rubber products or whether you have had any adverse reaction to Lidocaine or Epinephrine.  Please inform us of any prosthetic or artificial body parts such as artificial heart valve, joint replacements, etc., or similar condition that might require preoperative antibiotics.   We recommend avoidance of alcohol at least two weeks prior to surgery and continued avoidance for at least two weeks after surgery.   We recommend discontinuation of tobacco smoking at least two weeks prior to surgery and continued  abstinence for at least two weeks after surgery.  Do not plan strenuous exercise, strenuous work or strenuous lifting for approximately four weeks after your surgery.   We request if you are unable to make your scheduled surgical appointment, please call us at least a week in advance or as soon as you are aware of a  problem so that we can cancel or reschedule the appointment.   You MAY TAKE TYLENOL (acetaminophen) for pain as it is not a blood thinner.   PLEASE PLAN TO BE IN TOWN FOR TWO WEEKS FOLLOWING SURGERY, THIS IS IMPORTANT SO YOU CAN BE CHECKED FOR DRESSING CHANGES, SUTURE REMOVAL AND TO MONITOR FOR POSSIBLE COMPLICATIONS.     Due to recent changes in healthcare laws, you may see results of your pathology and/or laboratory studies on MyChart before the doctors have had a chance to review them. We understand that in some cases there may be results that are confusing or concerning to you. Please understand that not all results are received at the same time and often the doctors may need to interpret multiple results in order to provide you with the best plan of care or course of treatment. Therefore, we ask that you please give [place] 2 business days to thoroughly review all your results before contacting the office for clarification. Should we see a critical lab result, you will be contacted sooner.   If You Need Anything After Your Visit  If you have any questions or concerns for your doctor, please call our main line at [phone] and press option 4 to reach your doctor's medical assistant. If no one answers, please leave a voicemail as directed and we will return your call as soon as possible. Messages left after 4 pm will be answered the following business day.   You may also send [place] a message via [place]. We typically respond to MyChart messages within 1-2 business days.  For prescription refills, please ask your pharmacy to contact our office. Our fax number is [phone].  If you have an urgent issue when the clinic is closed that cannot wait until the next business day, you can page your doctor at the number below.    Please note that while we do our best to be available for urgent issues outside of office hours, we are not available 24/7.   If you have an urgent issue and are unable to  reach [place], you may choose to seek medical care at your doctor's office, retail clinic, urgent care center, or emergency room.  If you have a medical emergency, please immediately call 911 or go to the emergency department.  Pager Numbers  - Dr. Nehemiah Massed: [phone]  - Dr. Laurence Ferrari: [phone]  - Dr. Nicole Kindred: [phone]  In the event of inclement weather, please call our main line at [phone] for an update on the status of any delays or closures.  Dermatology Medication Tips: Please keep the boxes that topical medications come in in order to help keep track of the instructions about where and how to use these. Pharmacies typically print the medication instructions only on the boxes and not directly on the medication tubes.   If your medication is too expensive, please contact our office at [phone] option 4 or send [place] a message through [place].   We are unable to tell what your co-pay for medications will be in advance as this is different depending on your insurance coverage. However, we may be able to find a substitute medication at  lower cost or fill out paperwork to get insurance to cover a needed medication.   If a prior authorization is required to get your medication covered by your insurance company, please allow [place] 1-2 business days to complete this process.  Drug prices often vary depending on where the prescription is filled and some pharmacies may offer cheaper prices.  The website www.goodrx.com contains coupons for medications through different pharmacies. The prices here do not account for what the cost may be with help from insurance (it may be cheaper with your insurance), but the website can give you the price if you did not use any insurance.  - You can print the associated coupon and take it with your prescription to the pharmacy.  - You may also stop by our office during regular business hours and pick up a GoodRx coupon card.  - If you need your prescription  sent electronically to a different pharmacy, notify our office through [facility] or by phone at [phone] option 4.     Si Usted Necesita Algo Despus de Su Visita  Tambin puede enviarnos un mensaje a travs de Pharmacist, community. Por lo general respondemos a los mensajes de MyChart en el transcurso de 1 a 2 das hbiles.  Para renovar recetas, por favor pida a su farmacia que se ponga en contacto con nuestra oficina. Harland Dingwall de fax es [place] [phone].  Si tiene un asunto urgente cuando la clnica est cerrada y que no puede esperar hasta el siguiente da hbil, puede llamar/localizar a su doctor(a) al nmero que aparece a continuacin.   Por favor, tenga en cuenta que aunque hacemos todo lo posible para estar disponibles para asuntos urgentes fuera del horario de [place], no estamos disponibles las 24 horas del da, los 7 das de la [place].   Si tiene un problema urgente y no puede comunicarse con nosotros, puede optar por buscar atencin mdica  en el consultorio de su doctor(a), en una clnica privada, en un centro de atencin urgente o en una sala de emergencias.  Si tiene Engineering geologist, por favor llame inmediatamente al 911 o vaya a la sala de emergencias.  Nmeros de bper  - Dr. Nehemiah Massed: [phone]  - Dra. Moye: [phone]  - Dra. Nicole Kindred: [phone]  En caso de inclemencias del [place], por favor llame a Johnsie Kindred principal al [phone] para una actualizacin sobre el [place] de cualquier retraso o cierre.  Consejos para la medicacin en dermatologa: Por favor, guarde las cajas en las que vienen los medicamentos de uso tpico para ayudarle a seguir las instrucciones sobre dnde y cmo usarlos. Las farmacias generalmente imprimen las instrucciones del medicamento slo en las cajas y no directamente en los tubos del [place].   Si su medicamento es muy caro, por favor, pngase en contacto con Zigmund Daniel llamando al [phone] y presione  la opcin 4 o envenos un mensaje a travs de Pharmacist, community.   No podemos decirle cul ser su copago por los medicamentos por adelantado ya que esto es diferente dependiendo de la cobertura de su seguro. Sin embargo, es posible que podamos encontrar un medicamento sustituto a Electrical engineer un formulario para que el seguro cubra el medicamento que se considera necesario.   Si se requiere una autorizacin previa para que su compaa de seguros [place] su medicamento, por favor permtanos de 1 a 2 das hbiles para completar este proceso.  Los precios de los medicamentos varan con frecuencia dependiendo del Environmental consultant de dnde se  surte la receta y alguna farmacias pueden ofrecer precios ms baratos.  El sitio web www.goodrx.com tiene cupones para medicamentos de Airline pilot. Los precios aqu no tienen en cuenta lo que podra costar con la ayuda del seguro (puede ser ms barato con su seguro), pero el sitio web puede darle el precio si no utiliz Research scientist (physical sciences).  - Puede imprimir el cupn correspondiente y llevarlo con su receta a la farmacia.  - Tambin puede pasar por nuestra oficina durante el horario de atencin regular y Charity fundraiser una tarjeta de cupones de GoodRx.  - Si necesita que su receta se enve electrnicamente a una farmacia diferente, informe a nuestra oficina a travs de MyChart de [place] o por telfono llamando al [phone] y presione la opcin 4.

## 2022-06-20 ENCOUNTER — Ambulatory Visit (INDEPENDENT_AMBULATORY_CARE_PROVIDER_SITE_OTHER): Payer: PPO | Admitting: Clinical

## 2022-06-20 DIAGNOSIS — F4322 Adjustment disorder with anxiety: Secondary | ICD-10-CM | POA: Diagnosis not present
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
## 2022-06-20 NOTE — Progress Notes (Signed)
                Wojciech Willetts, LCSW 

## 2022-07-03 DIAGNOSIS — C44319 Basal cell carcinoma of skin of other parts of face: Secondary | ICD-10-CM | POA: Diagnosis not present

## 2022-07-17 ENCOUNTER — Ambulatory Visit: Payer: PPO | Admitting: Clinical

## 2022-07-25 ENCOUNTER — Telehealth: Payer: Self-pay

## 2022-07-25 NOTE — Telephone Encounter (Signed)
Updated specimen tracking and history from MOH[facility] progress notes. aw 

## 2022-08-13 ENCOUNTER — Encounter: Payer: PPO | Admitting: Dermatology

## 2022-08-15 ENCOUNTER — Ambulatory Visit (INDEPENDENT_AMBULATORY_CARE_PROVIDER_SITE_OTHER): Payer: PPO | Admitting: Clinical

## 2022-08-15 DIAGNOSIS — F4322 Adjustment disorder with anxiety: Secondary | ICD-10-CM
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
## 2022-08-27 ENCOUNTER — Encounter: Payer: PPO | Admitting: Dermatology

## 2022-09-03 ENCOUNTER — Telehealth: Payer: Self-pay | Admitting: *Deleted

## 2022-09-03 NOTE — Patient Outreach (Signed)
  Care Coordination   09/03/2022 Name: Connie Glass MRN: [id number] DOB: 09-Oct-1950   Care Coordination Outreach Attempts:  An unsuccessful telephone outreach was attempted today to offer the patient information about available care coordination services as a benefit of their health plan.   Follow Up Plan:  Additional outreach attempts will be made to offer the patient care coordination information and services.   Encounter Outcome:  No Answer   Care Coordination Interventions:  No, not indicated    Valente David, RN, MSN, [facility] Care Management Care Management Coordinator [phone]

## 2022-09-06 ENCOUNTER — Ambulatory Visit (INDEPENDENT_AMBULATORY_CARE_PROVIDER_SITE_OTHER): Payer: PPO | Admitting: Clinical

## 2022-09-06 ENCOUNTER — Telehealth: Payer: Self-pay | Admitting: *Deleted

## 2022-09-06 DIAGNOSIS — F4322 Adjustment disorder with anxiety: Secondary | ICD-10-CM

## 2022-09-06 NOTE — Patient Outreach (Signed)
  Care Coordination   Initial Visit Note   09/06/2022 Name: Connie Glass MRN: [id number] DOB: July 21, 1951  Connie Glass is a 72 y.o. year old female who sees Adin Hector, MD for primary care. I spoke with  Salomon Fick by phone today.  What matters to the patients health and wellness today?  Patient state she is in good health, does not feel the need for [facility] involvement at this time but she is receptive to receiving program information in the mail.  Will call with questions.       SDOH assessments and interventions completed:  No     Care Coordination Interventions:  Yes, provided   Follow up plan: No further intervention required.   Encounter Outcome:  Pt. Visit Completed   Valente David, RN, MSN, [place] Care Management Care Management Coordinator [phone]

## 2022-09-06 NOTE — Progress Notes (Signed)
                Katherina Right, LCSW
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
## 2022-09-10 ENCOUNTER — Ambulatory Visit: Payer: PPO | Admitting: Dermatology

## 2022-09-18 ENCOUNTER — Ambulatory Visit: Payer: BC Managed Care – PPO | Admitting: Clinical

## 2022-09-18 DIAGNOSIS — E890 Postprocedural hypothyroidism: Secondary | ICD-10-CM | POA: Diagnosis not present

## 2022-09-18 DIAGNOSIS — R7301 Impaired fasting glucose: Secondary | ICD-10-CM | POA: Diagnosis not present

## 2022-09-25 ENCOUNTER — Other Ambulatory Visit: Payer: Self-pay | Admitting: Endocrinology

## 2022-09-25 DIAGNOSIS — E6609 Other obesity due to excess calories: Secondary | ICD-10-CM | POA: Diagnosis not present

## 2022-09-25 DIAGNOSIS — R7301 Impaired fasting glucose: Secondary | ICD-10-CM | POA: Diagnosis not present

## 2022-09-25 DIAGNOSIS — E049 Nontoxic goiter, unspecified: Secondary | ICD-10-CM | POA: Diagnosis not present

## 2022-09-25 DIAGNOSIS — R635 Abnormal weight gain: Secondary | ICD-10-CM | POA: Diagnosis not present

## 2022-09-25 DIAGNOSIS — M810 Age-related osteoporosis without current pathological fracture: Secondary | ICD-10-CM | POA: Diagnosis not present

## 2022-09-25 DIAGNOSIS — E890 Postprocedural hypothyroidism: Secondary | ICD-10-CM | POA: Diagnosis not present

## 2022-09-25 DIAGNOSIS — E7800 Pure hypercholesterolemia, unspecified: Secondary | ICD-10-CM | POA: Diagnosis not present

## 2022-09-28 ENCOUNTER — Other Ambulatory Visit: Payer: Self-pay | Admitting: Internal Medicine

## 2022-09-28 DIAGNOSIS — R053 Chronic cough: Secondary | ICD-10-CM

## 2022-09-28 DIAGNOSIS — Z8249 Family history of ischemic heart disease and other diseases of the circulatory system: Secondary | ICD-10-CM

## 2022-10-02 DIAGNOSIS — M810 Age-related osteoporosis without current pathological fracture: Secondary | ICD-10-CM | POA: Diagnosis not present

## 2022-10-09 ENCOUNTER — Ambulatory Visit
Admission: RE | Admit: 2022-10-09 | Discharge: 2022-10-09 | Disposition: A | Discharge status: Home / Self Care | Payer: PPO | Source: Ambulatory Visit | Attending: Endocrinology | Admitting: Endocrinology

## 2022-10-09 DIAGNOSIS — E049 Nontoxic goiter, unspecified: Secondary | ICD-10-CM

## 2022-10-09 DIAGNOSIS — E042 Nontoxic multinodular goiter: Secondary | ICD-10-CM | POA: Diagnosis not present

## 2022-10-10 ENCOUNTER — Other Ambulatory Visit: Payer: Self-pay | Admitting: Internal Medicine

## 2022-10-10 ENCOUNTER — Ambulatory Visit
Admission: RE | Admit: 2022-10-10 | Discharge: 2022-10-10 | Disposition: A | Discharge status: Home / Self Care | Payer: PPO | Source: Ambulatory Visit | Attending: Internal Medicine | Admitting: Internal Medicine

## 2022-10-10 DIAGNOSIS — R059 Cough, unspecified: Secondary | ICD-10-CM | POA: Diagnosis not present

## 2022-10-10 DIAGNOSIS — Z8249 Family history of ischemic heart disease and other diseases of the circulatory system: Secondary | ICD-10-CM

## 2022-10-10 DIAGNOSIS — R053 Chronic cough: Secondary | ICD-10-CM

## 2022-10-10 MED ORDER — IOHEXOL 350 MG/ML SOLN
75.0000 mL | Freq: Once | INTRAVENOUS | Status: AC | PRN
  Administered 2022-10-10: 75 mL via INTRAVENOUS

## 2022-10-10 MED ORDER — IOHEXOL 300 MG/ML  SOLN: 75.0000 mL | Freq: Once | INTRAMUSCULAR | Status: DC | PRN

## 2022-10-12 ENCOUNTER — Other Ambulatory Visit: Payer: Self-pay | Admitting: Endocrinology

## 2022-10-12 DIAGNOSIS — E041 Nontoxic single thyroid nodule: Secondary | ICD-10-CM

## 2022-11-09 DIAGNOSIS — E034 Atrophy of thyroid (acquired): Secondary | ICD-10-CM | POA: Diagnosis not present

## 2022-11-09 DIAGNOSIS — R7303 Prediabetes: Secondary | ICD-10-CM | POA: Diagnosis not present

## 2022-11-09 DIAGNOSIS — M519 Unspecified thoracic, thoracolumbar and lumbosacral intervertebral disc disorder: Secondary | ICD-10-CM | POA: Diagnosis not present

## 2022-11-09 DIAGNOSIS — R3121 Asymptomatic microscopic hematuria: Secondary | ICD-10-CM | POA: Diagnosis not present

## 2022-11-09 DIAGNOSIS — E785 Hyperlipidemia, unspecified: Secondary | ICD-10-CM | POA: Diagnosis not present

## 2022-11-13 ENCOUNTER — Other Ambulatory Visit: Payer: PPO

## 2022-11-14 ENCOUNTER — Other Ambulatory Visit (HOSPITAL_COMMUNITY)
Admission: RE | Admit: 2022-11-14 | Discharge: 2022-11-14 | Disposition: A | Discharge status: Home / Self Care | Payer: PPO | Source: Ambulatory Visit | Attending: Endocrinology | Admitting: Endocrinology

## 2022-11-14 ENCOUNTER — Ambulatory Visit
Admission: RE | Admit: 2022-11-14 | Discharge: 2022-11-14 | Disposition: A | Discharge status: Home / Self Care | Payer: PPO | Source: Ambulatory Visit | Attending: Endocrinology | Admitting: Endocrinology

## 2022-11-14 DIAGNOSIS — R896 Abnormal cytological findings in specimens from other organs, systems and tissues: Secondary | ICD-10-CM | POA: Diagnosis not present

## 2022-11-14 DIAGNOSIS — E041 Nontoxic single thyroid nodule: Secondary | ICD-10-CM

## 2022-11-14 DIAGNOSIS — E042 Nontoxic multinodular goiter: Secondary | ICD-10-CM | POA: Diagnosis not present

## 2022-11-15 DIAGNOSIS — H40053 Ocular hypertension, bilateral: Secondary | ICD-10-CM | POA: Diagnosis not present

## 2022-11-16 LAB — CYTOLOGY - NON PAP

## 2022-11-19 DIAGNOSIS — R911 Solitary pulmonary nodule: Secondary | ICD-10-CM | POA: Diagnosis not present

## 2022-11-19 DIAGNOSIS — Z23 Encounter for immunization: Secondary | ICD-10-CM | POA: Diagnosis not present

## 2022-11-19 DIAGNOSIS — E034 Atrophy of thyroid (acquired): Secondary | ICD-10-CM | POA: Diagnosis not present

## 2022-11-19 DIAGNOSIS — R3121 Asymptomatic microscopic hematuria: Secondary | ICD-10-CM | POA: Diagnosis not present

## 2022-11-19 DIAGNOSIS — M519 Unspecified thoracic, thoracolumbar and lumbosacral intervertebral disc disorder: Secondary | ICD-10-CM | POA: Diagnosis not present

## 2022-11-19 DIAGNOSIS — I2510 Atherosclerotic heart disease of native coronary artery without angina pectoris: Secondary | ICD-10-CM | POA: Diagnosis not present

## 2022-11-19 DIAGNOSIS — R7303 Prediabetes: Secondary | ICD-10-CM | POA: Diagnosis not present

## 2022-11-19 DIAGNOSIS — E785 Hyperlipidemia, unspecified: Secondary | ICD-10-CM | POA: Diagnosis not present

## 2022-11-21 DIAGNOSIS — E042 Nontoxic multinodular goiter: Secondary | ICD-10-CM | POA: Diagnosis not present

## 2022-12-03 ENCOUNTER — Encounter (HOSPITAL_COMMUNITY): Payer: Self-pay

## 2022-12-18 DIAGNOSIS — C44319 Basal cell carcinoma of skin of other parts of face: Secondary | ICD-10-CM | POA: Diagnosis not present

## 2023-01-14 DIAGNOSIS — J301 Allergic rhinitis due to pollen: Secondary | ICD-10-CM | POA: Diagnosis not present

## 2023-01-14 DIAGNOSIS — R052 Subacute cough: Secondary | ICD-10-CM | POA: Diagnosis not present

## 2023-01-14 DIAGNOSIS — H1045 Other chronic allergic conjunctivitis: Secondary | ICD-10-CM | POA: Diagnosis not present

## 2023-01-14 DIAGNOSIS — J3089 Other allergic rhinitis: Secondary | ICD-10-CM | POA: Diagnosis not present

## 2023-01-16 DIAGNOSIS — E039 Hypothyroidism, unspecified: Secondary | ICD-10-CM | POA: Diagnosis not present

## 2023-01-16 DIAGNOSIS — M79661 Pain in right lower leg: Secondary | ICD-10-CM | POA: Diagnosis not present

## 2023-01-16 DIAGNOSIS — M79604 Pain in right leg: Secondary | ICD-10-CM | POA: Diagnosis not present

## 2023-01-16 DIAGNOSIS — Z87891 Personal history of nicotine dependence: Secondary | ICD-10-CM | POA: Diagnosis not present

## 2023-01-16 DIAGNOSIS — M25561 Pain in right knee: Secondary | ICD-10-CM | POA: Diagnosis not present

## 2023-01-23 ENCOUNTER — Other Ambulatory Visit: Payer: Self-pay | Admitting: Internal Medicine

## 2023-01-23 DIAGNOSIS — Z1231 Encounter for screening mammogram for malignant neoplasm of breast: Secondary | ICD-10-CM

## 2023-01-28 ENCOUNTER — Other Ambulatory Visit (INDEPENDENT_AMBULATORY_CARE_PROVIDER_SITE_OTHER): Payer: Self-pay | Admitting: Vascular Surgery

## 2023-01-28 DIAGNOSIS — I8311 Varicose veins of right lower extremity with inflammation: Secondary | ICD-10-CM

## 2023-01-30 NOTE — Progress Notes (Signed)
MRN : [id number]  Connie Glass is a 72 y.o. ([birth year]) female who presents with chief complaint of varicose veins hurt.  History of Present Illness:   The patient is seen for evaluation of symptomatic varicose veins. The patient relates burning and stinging which worsened steadily throughout the course of the day, particularly with standing. The patient also notes an aching and throbbing pain over the varicosities, particularly with prolonged dependent positions. The symptoms are significantly improved with elevation.  The patient also notes that during hot weather the symptoms are greatly intensified. The patient states the pain from the varicose veins interferes with work, daily exercise, shopping and household maintenance. At this point, the symptoms are persistent and severe enough that they're having a negative impact on lifestyle and are interfering with daily activities.   There is a history of prior surgical intervention and sclerotherapy.   There is no history of DVT, PE or superficial thrombophlebitis. There is no history of ulceration or hemorrhage. The patient denies a significant family history of varicose veins.   The patient has worn graduated compression in the past. At the present time the patient has been using over-the-counter analgesics.    Duplex ultrasound of the venous system obtained today demonstrates normal deep system there is evidence for prior ablation/stripping of the great saphenous vein bilaterally.  Extensive varicosities are noted bilaterally.    No outpatient medications have been marked as taking for the 01/31/23 encounter (Appointment) with Gilda Crease, Latina Craver, MD.    Past Medical History:  Diagnosis Date   Basal cell carcinoma 04/25/2022   R paranasal, [facility] 07/03/2022   Peripheral vascular disease ([facility])     Past Surgical History:  Procedure Laterality Date   BREAST EXCISIONAL BIOPSY Left 2010   Benign     Social  History Social History   Tobacco Use   Smoking status: Former   Smokeless tobacco: Former  Substance Use Topics   Alcohol use: Yes    Comment: social   Drug use: No    Family History No family history on file.  Allergies  Allergen Reactions   Other Anaphylaxis    Clams,crabs,whey,milk Mushrooms, bees   American Cockroach    [place] Grass Extract    Dust Mite Mixed Allergen Ext [Mite (D. Farinae)]    Shellfish Allergy      REVIEW OF SYSTEMS (Negative unless checked)  Constitutional: [] Weight loss  [] Fever  [] Chills Cardiac: [] Chest pain   [] Chest pressure   [] Palpitations   [] Shortness of breath when laying flat   [] Shortness of breath with exertion. Vascular:  [] Pain in legs with walking   [x] Pain in legs with standing  [] History of DVT   [] Phlebitis   [] Swelling in legs   [x] Varicose veins   [] Non-healing ulcers Pulmonary:   [] Uses home oxygen   [] Productive cough   [] Hemoptysis   [] Wheeze  [] COPD   [] Asthma Neurologic:  [] Dizziness   [] Seizures   [] History of stroke   [] History of TIA  [] Aphasia   [] Vissual changes   [] Weakness or numbness in arm   [] Weakness or numbness in leg Musculoskeletal:   [] Joint swelling   [] Joint pain   [] Low back pain Hematologic:  [] Easy bruising  [] Easy bleeding   [] Hypercoagulable state   [] Anemic Gastrointestinal:  [] Diarrhea   [] Vomiting  [] Gastroesophageal reflux/heartburn   [] Difficulty swallowing. Genitourinary:  [] Chronic kidney disease   [] Difficult urination  [] Frequent urination   []   Blood in urine Skin:  [] Rashes   [] Ulcers  Psychological:  [] History of anxiety   []  History of major depression.  Physical Examination  There were no vitals filed for this visit. There is no height or weight on file to calculate BMI. Gen: WD/WN, NAD Head: [place]/AT, No temporalis wasting.  Ear/Nose/Throat: Hearing grossly intact, nares w/o erythema or drainage, pinna without lesions Eyes: PER, EOMI, sclera nonicteric.  Neck: Supple, no gross masses.   No JVD.  Pulmonary:  Good air movement, no audible wheezing, no use of accessory muscles.  Cardiac: RRR, precordium not hyperdynamic. Vascular:  Large varicosities present, greater than 10 mm bilaterally.  Veins are tender to palpation  Mild venous stasis changes to the legs bilaterally.  Trace soft pitting edema CEAP C3sEpAsPr Vessel Right Left  Radial Palpable Palpable  Gastrointestinal: soft, non-distended. No guarding/no peritoneal signs.  Musculoskeletal: M/S 5/5 throughout.  No deformity.  Neurologic: CN 2-12 intact. Pain and light touch intact in extremities.  Symmetrical.  Speech is fluent. Motor exam as listed above. Psychiatric: Judgment intact, Mood & affect appropriate for pt's clinical situation. Dermatologic: Venous rashes no ulcers noted.  No changes consistent with cellulitis. Lymph : No lichenification or skin changes of chronic lymphedema.  CBC Lab Results  Component Value Date   WBC 5.7 05/04/2009   HGB 13.0 05/04/2009   HCT 38.1 05/04/2009   MCV 90.4 05/04/2009   PLT 269 05/04/2009    BMET    Component Value Date/Time   NA 139 05/04/2009 1310   K 3.9 05/04/2009 1310   CL 104 05/04/2009 1310   CO2 29 05/04/2009 1310   GLUCOSE 87 05/04/2009 1310   BUN 11 05/04/2009 1310   CREATININE 0.62 05/04/2009 1310   CALCIUM 8.8 05/04/2009 1310   GFRNONAA >60 05/04/2009 1310   GFRAA  05/04/2009 1310    >60        The eGFR has been calculated using the MDRD equation. This calculation has not been validated in all clinical situations. eGFR's persistently <60 mL/min signify possible Chronic Kidney Disease.   CrCl cannot be calculated (Patient's most recent lab result is older than the maximum 21 days allowed.).  COAG No results found for: "INR", "PROTIME"  Radiology No results found.   Assessment/Plan 1. Varicose veins of both lower extremities with inflammation Recommend:  The patient has had successful ablation of the previously incompetent saphenous  venous system but still has persistent symptoms of pain and swelling that are having a negative impact on daily life and daily activities.  Patient should undergo injection sclerotherapy to treat the residual varicosities.  The risks, benefits and alternative therapies were reviewed in detail with the patient.  All questions were answered.  The patient agrees to proceed with sclerotherapy at their convenience.  The patient will continue wearing the graduated compression stockings and using the over-the-counter pain medications to treat her symptoms.     A total of 30 minutes was spent with this patient and greater than 50% was spent in counseling and coordination of care with the patient.  Discussion included the treatment options for vascular disease including indications for surgery and intervention.  Also discussed is the appropriate timing of treatment.  In addition medical therapy was discussed.  2. Chronic venous insufficiency No surgery or intervention at this point in time.   The patient is CEAP C4sEpAsPr   I have discussed with the patient venous insufficiency and why it  causes symptoms. I have discussed with the patient the  chronic skin changes that accompany venous insufficiency and the long term sequela such as infection and ulceration.  Patient will begin wearing graduated compression stockings or compression wraps on a daily basis.  The patient will put the compression on first thing in the morning and removing them in the evening. The patient is instructed specifically not to sleep in the compression.    In addition, behavioral modification including several periods of elevation of the lower extremities during the day will be continued. I have demonstrated that proper elevation is a position with the ankles at heart level.  The patient is instructed to begin routine exercise, especially walking on a daily basis  The patient will be assessed for a Lymph Pump depending on the  effectiveness of conservative therapy and the control of the associated lymphedema.  3. Lumbar disc disease Continue NSAID medications as already ordered, these medications have been reviewed and there are no changes at this time.  Continued activity and therapy was stressed.  4. Hypothyroidism, unspecified type Continue hormone replacement as ordered and reviewed, no changes at this time    Levora Dredge, MD  01/30/2023 7:58 AM

## 2023-01-31 ENCOUNTER — Encounter (INDEPENDENT_AMBULATORY_CARE_PROVIDER_SITE_OTHER): Payer: Self-pay | Admitting: Vascular Surgery

## 2023-01-31 ENCOUNTER — Ambulatory Visit (INDEPENDENT_AMBULATORY_CARE_PROVIDER_SITE_OTHER): Payer: PPO

## 2023-01-31 ENCOUNTER — Ambulatory Visit (INDEPENDENT_AMBULATORY_CARE_PROVIDER_SITE_OTHER): Payer: PPO | Admitting: Vascular Surgery

## 2023-01-31 VITALS — BP 138/70 | HR 69 | Resp 16 | Wt 204.0 lb

## 2023-01-31 DIAGNOSIS — I8312 Varicose veins of left lower extremity with inflammation: Secondary | ICD-10-CM

## 2023-01-31 DIAGNOSIS — I8311 Varicose veins of right lower extremity with inflammation: Secondary | ICD-10-CM

## 2023-01-31 DIAGNOSIS — I872 Venous insufficiency (chronic) (peripheral): Secondary | ICD-10-CM

## 2023-01-31 DIAGNOSIS — M519 Unspecified thoracic, thoracolumbar and lumbosacral intervertebral disc disorder: Secondary | ICD-10-CM | POA: Diagnosis not present

## 2023-01-31 DIAGNOSIS — E039 Hypothyroidism, unspecified: Secondary | ICD-10-CM | POA: Diagnosis not present

## 2023-02-03 ENCOUNTER — Encounter (INDEPENDENT_AMBULATORY_CARE_PROVIDER_SITE_OTHER): Payer: Self-pay | Admitting: Vascular Surgery

## 2023-02-12 ENCOUNTER — Ambulatory Visit (INDEPENDENT_AMBULATORY_CARE_PROVIDER_SITE_OTHER): Payer: PPO | Admitting: Licensed Clinical Social Worker

## 2023-02-12 DIAGNOSIS — F4322 Adjustment disorder with anxiety: Secondary | ICD-10-CM | POA: Diagnosis not present
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
## 2023-02-18 ENCOUNTER — Ambulatory Visit (INDEPENDENT_AMBULATORY_CARE_PROVIDER_SITE_OTHER): Payer: PPO | Admitting: Dermatology

## 2023-02-18 VITALS — BP 144/78

## 2023-02-18 DIAGNOSIS — D485 Neoplasm of uncertain behavior of skin: Secondary | ICD-10-CM

## 2023-02-18 DIAGNOSIS — L720 Epidermal cyst: Secondary | ICD-10-CM

## 2023-02-18 NOTE — Progress Notes (Signed)
   Follow-Up Visit   Subjective  Connie Glass is a 72 y.o. female who presents for the following: Cyst (Left posterior neck, patient presents for excision.). Has grown.   The following portions of the chart were reviewed this encounter and updated as appropriate:       Review of Systems:  No other skin or systemic complaints except as noted in HPI or Assessment and Plan.  Objective  Well appearing patient in no apparent distress; mood and affect are within normal limits.  A focused examination was performed including face, neck. Relevant physical exam findings are noted in the Assessment and Plan.  Left Posterior Neck 1.1 x 0.7 CM firm subcutaneous nodule.    Assessment & Plan  Neoplasm of uncertain behavior of skin Left Posterior Neck  Skin excision  Lesion length (cm):  1.1 Lesion width (cm):  0.7 Margin per side (cm):  0.1 Total excision diameter (cm):  1.3 Informed consent: discussed and consent obtained   Timeout: patient name, date of birth, surgical site, and procedure verified   Procedure prep:  Patient was prepped and draped in usual sterile fashion Prep type:  Povidone-iodine Anesthesia: the lesion was anesthetized in a standard fashion   Anesthetic:  1% lidocaine w/ epinephrine 1-100,000 buffered w/ 8.4% NaHCO3 (Total 9cc - 6cc lido w/epi, 3cc bupivacaine) Instrument used comment:  #15c blade Hemostasis achieved with: pressure and electrodesiccation   Outcome: patient tolerated procedure well with no complications    Skin repair Complexity:  Intermediate Final length (cm):  2.1 Informed consent: discussed and consent obtained   Timeout: patient name, date of birth, surgical site, and procedure verified   Reason for type of repair: reduce tension to allow closure, reduce the risk of dehiscence, infection, and necrosis, reduce subcutaneous dead space and avoid a hematoma, preserve normal anatomical and functional relationships and enhance both  functionality and cosmetic results   Undermining: edges undermined   Subcutaneous layers (deep stitches):  Suture size:  4-0 Suture type: Vicryl (polyglactin 910)   Stitches:  Buried vertical mattress Fine/surface layer approximation (top stitches):  Suture size:  4-0 Suture type: nylon   Stitches: simple interrupted   Suture removal (days):  7 Hemostasis achieved with: suture Outcome: patient tolerated procedure well with no complications   Post-procedure details: sterile dressing applied and wound care instructions given   Dressing type: pressure dressing (mupirocin)    Specimen 1 - Surgical pathology Differential Diagnosis: Cyst vs other Check Margins: No   Return in about 1 week (around 02/25/2023) for suture removal.  I, Cherlyn Labella, CMA, am acting as scribe for Willeen Niece, MD .  Documentation: I have reviewed the above documentation for accuracy and completeness, and I agree with the above.  Willeen Niece MD

## 2023-02-18 NOTE — Patient Instructions (Signed)
Wound Care Instructions for After Surgery  On the day following your surgery, you should begin doing daily dressing changes until your sutures are removed: Remove the bandage. Cleanse the wound gently with soap and water.  Make sure you then dry the skin surrounding the wound completely or the tape will not stick to the skin. Do not use cotton balls on the wound. After the wound is clean and dry, apply the ointment (either prescription antibiotic prescribed by your doctor or plain Vaseline if nothing was prescribed) gently with a Q-tip. If you are using a bandaid to cover: Apply a bandaid large enough to cover the entire wound. If you do not have a bandaid large enough to cover the wound OR if you are sensitive to bandaid adhesive: Cut a non-stick pad (such as Telfa) to fit the size of the wound.  Cover the wound with the non-stick pad. If the wound is draining, you may want to add a small amount of gauze on top of the non-stick pad for a little added compression to the area. Use tape to seal the area completely.  For the next 1-2 weeks: Be sure to keep the wound moist with ointment 24/7 to ensure best healing. If you are unable to cover the wound with a bandage to hold the ointment in place, you may need to reapply the ointment several times a day. Do not bend over or lift heavy items to reduce the chance of elevated blood pressure to the wound. Do not participate in particularly strenuous activities.  Below is a list of dressing supplies you might need.  Cotton-tipped applicators - Q-tips Gauze pads (2x2 and/or 4x4) - All-Purpose Sponges New and clean tube of petroleum jelly (Vaseline) OR prescription antibiotic ointment if prescribed Either a bandaid large enough to cover the entire wound OR non-stick dressing material (Telfa) and Tape (Paper or Hypafix)  FOR ADULT SURGERY PATIENTS: If you need something for pain relief, you may take 1 extra strength Tylenol (acetaminophen) and 2  ibuprofen (200 mg) together every 4 hours as needed. (Do not take these medications if you are allergic to them or if you know you cannot take them for any other reason). Typically you may only need pain medication for 1-3 days.   Comments on the Post-Operative Period Slight swelling and redness often appear around the wound. This is normal and will disappear within several days following the surgery. The healing wound will drain a brownish-red-yellow discharge during healing. This is a normal phase of wound healing. As the wound begins to heal, the drainage may increase in amount. Again, this drainage is normal. Notify us if the drainage becomes persistently bloody, excessively swollen, or intensely painful or develops a foul odor or red streaks.  The healing wound will also typically be itchy. This is normal. If you have severe or persistent pain, Notify us if the discomfort is severe or persistent. Avoid alcoholic beverages when taking pain medicine.  In Case of Wound Hemorrhage A wound hemorrhage is when the bandage suddenly becomes soaked with bright red blood and flows profusely. If this happens, sit down or lie down with your head elevated. If the wound has a dressing on it, do not remove the dressing. Apply pressure to the existing gauze. If the wound is not covered, use a gauze pad to apply pressure and continue applying the pressure for 20 minutes without peeking. DO NOT COVER THE WOUND WITH A LARGE TOWEL OR WASH CLOTH. Release your hand from the   wound site but do not remove the dressing. If the bleeding has stopped, gently clean around the wound. Leave the dressing in place for 24 hours if possible. This wait time allows the blood vessels to close off so that you do not spark a new round of bleeding by disrupting the newly clotted blood vessels with an immediate dressing change. If the bleeding does not subside, continue to hold pressure for 40 minutes. If bleeding continues, page your  physician, contact an After Hours clinic or go to the Emergency Room.   Due to recent changes in healthcare laws, you may see results of your pathology and/or laboratory studies on MyChart before the doctors have had a chance to review them. We understand that in some cases there may be results that are confusing or concerning to you. Please understand that not all results are received at the same time and often the doctors may need to interpret multiple results in order to provide you with the best plan of care or course of treatment. Therefore, we ask that you please give us 2 business days to thoroughly review all your results before contacting the office for clarification. Should we see a critical lab result, you will be contacted sooner.   If You Need Anything After Your Visit  If you have any questions or concerns for your doctor, please call our main line at [phone] and press option 4 to reach your doctor's medical assistant. If no one answers, please leave a voicemail as directed and we will return your call as soon as possible. Messages left after 4 pm will be answered the following business day.   You may also send us a message via MyChart. We typically respond to MyChart messages within 1-2 business days.  For prescription refills, please ask your pharmacy to contact our office. Our fax number is [phone].  If you have an urgent issue when the clinic is closed that cannot wait until the next business day, you can page your doctor at the number below.    Please note that while we do our best to be available for urgent issues outside of office hours, we are not available 24/7.   If you have an urgent issue and are unable to reach us, you may choose to seek medical care at your doctor's office, retail clinic, urgent care center, or emergency room.  If you have a medical emergency, please immediately call 911 or go to the emergency department.  Pager Numbers  - Dr. Kowalski:  [phone]  - Dr. Moye: [phone]  - Dr. Stewart: [phone]  In the event of inclement weather, please call our main line at [phone] for an update on the status of any delays or closures.  Dermatology Medication Tips: Please keep the boxes that topical medications come in in order to help keep track of the instructions about where and how to use these. Pharmacies typically print the medication instructions only on the boxes and not directly on the medication tubes.   If your medication is too expensive, please contact our office at [phone] option 4 or send us a message through MyChart.   We are unable to tell what your co-pay for medications will be in advance as this is different depending on your insurance coverage. However, we may be able to find a substitute medication at lower cost or fill out paperwork to get insurance to cover a needed medication.   If a prior authorization is required to get your medication covered   by your insurance company, please allow us 1-2 business days to complete this process.  Drug prices often vary depending on where the prescription is filled and some pharmacies may offer cheaper prices.  The website www.goodrx.com contains coupons for medications through different pharmacies. The prices here do not account for what the cost may be with help from insurance (it may be cheaper with your insurance), but the website can give you the price if you did not use any insurance.  - You can print the associated coupon and take it with your prescription to the pharmacy.  - You may also stop by our office during regular business hours and pick up a GoodRx coupon card.  - If you need your prescription sent electronically to a different pharmacy, notify our office through [facility] MyChart or by phone at [phone] option 4.     Si Usted Necesita Algo Despus de Su Visita  Tambin puede enviarnos un mensaje a travs de MyChart. Por lo general  respondemos a los mensajes de MyChart en el transcurso de 1 a 2 das hbiles.  Para renovar recetas, por favor pida a su farmacia que se ponga en contacto con nuestra oficina. Nuestro nmero de fax es el [phone].  Si tiene un asunto urgente cuando la clnica est cerrada y que no puede esperar hasta el siguiente da hbil, puede llamar/localizar a su doctor(a) al nmero que aparece a continuacin.   Por favor, tenga en cuenta que aunque hacemos todo lo posible para estar disponibles para asuntos urgentes fuera del horario de oficina, no estamos disponibles las 24 horas del da, los 7 das de la semana.   Si tiene un problema urgente y no puede comunicarse con nosotros, puede optar por buscar atencin mdica  en el consultorio de su doctor(a), en una clnica privada, en un centro de atencin urgente o en una sala de emergencias.  Si tiene una emergencia mdica, por favor llame inmediatamente al 911 o vaya a la sala de emergencias.  Nmeros de bper  - Dr. Kowalski: [phone]  - Dra. Moye: [phone]  - Dra. Stewart: [phone]  En caso de inclemencias del tiempo, por favor llame a nuestra lnea principal al [phone] para una actualizacin sobre el estado de cualquier retraso o cierre.  Consejos para la medicacin en dermatologa: Por favor, guarde las cajas en las que vienen los medicamentos de uso tpico para ayudarle a seguir las instrucciones sobre dnde y cmo usarlos. Las farmacias generalmente imprimen las instrucciones del medicamento slo en las cajas y no directamente en los tubos del medicamento.   Si su medicamento es muy caro, por favor, pngase en contacto con nuestra oficina llamando al [phone] y presione la opcin 4 o envenos un mensaje a travs de MyChart.   No podemos decirle cul ser su copago por los medicamentos por adelantado ya que esto es diferente dependiendo de la cobertura de su seguro. Sin embargo, es posible que podamos encontrar un  medicamento sustituto a menor costo o llenar un formulario para que el seguro cubra el medicamento que se considera necesario.   Si se requiere una autorizacin previa para que su compaa de seguros cubra su medicamento, por favor permtanos de 1 a 2 das hbiles para completar este proceso.  Los precios de los medicamentos varan con frecuencia dependiendo del lugar de dnde se surte la receta y alguna farmacias pueden ofrecer precios ms baratos.  El sitio web www.goodrx.com tiene cupones para medicamentos de diferentes farmacias. Los precios aqu no   tienen en cuenta lo que podra costar con la ayuda del seguro (puede ser ms barato con su seguro), pero el sitio web puede darle el precio si no utiliz ningn seguro.  - Puede imprimir el cupn correspondiente y llevarlo con su receta a la farmacia.  - Tambin puede pasar por nuestra oficina durante el horario de atencin regular y recoger una tarjeta de cupones de GoodRx.  - Si necesita que su receta se enve electrnicamente a una farmacia diferente, informe a nuestra oficina a travs de MyChart de [facility] o por telfono llamando al [phone] y presione la opcin 4.   

## 2023-02-19 ENCOUNTER — Ambulatory Visit: Payer: PPO | Admitting: Licensed Clinical Social Worker

## 2023-02-19 ENCOUNTER — Telehealth: Payer: Self-pay

## 2023-02-19 NOTE — Telephone Encounter (Signed)
Talked to patient and she is doing fine from yesterday's surgery. She will call with any questions or concerns.

## 2023-02-25 ENCOUNTER — Ambulatory Visit (INDEPENDENT_AMBULATORY_CARE_PROVIDER_SITE_OTHER): Payer: PPO | Admitting: Dermatology

## 2023-02-25 DIAGNOSIS — L729 Follicular cyst of the skin and subcutaneous tissue, unspecified: Secondary | ICD-10-CM

## 2023-02-25 NOTE — Progress Notes (Signed)
   Follow-Up Visit   Subjective  Connie Glass is a 72 y.o. female who presents for the following: Suture removal  Pathology showed epidermal inclusion cyst.  The following portions of the chart were reviewed this encounter and updated as appropriate: medications, allergies, medical history  Review of Systems:  No other skin or systemic complaints except as noted in HPI or Assessment and Plan.  Objective  Well appearing patient in no apparent distress; mood and affect are within normal limits.  Areas Examined: Left posterior neck  Relevant physical exam findings are noted in the Assessment and Plan.    Assessment & Plan    Encounter for Removal of Sutures - Incision site is clean, dry and intact. - Wound cleansed, sutures removed, wound cleansed and steri strips applied.  - Discussed pathology results showing epidermal inclusion cyst. - Patient advised to keep steri-strips dry until they fall off. - Scars remodel for a full year. - Once steri-strips fall off, patient can apply over-the-counter silicone scar cream once to twice a day to help with scar remodeling if desired. - Patient advised to call with any concerns or if they notice any new or changing lesions.  Return for TBSE.  ICherlyn Labella, CMA, am acting as scribe for Willeen Niece, MD .   Documentation: I have reviewed the above documentation for accuracy and completeness, and I agree with the above.  Willeen Niece, MD

## 2023-02-25 NOTE — Patient Instructions (Signed)
   After Suture Removal  If your medical team has placed Steri-Strips (white adhesive strips covering the surgical site to provide extra support): Keep the area dry until they fall off.  Do not peel them off. Just let them fall off on their own.  If the edges peel up, you can trim them with scissors.   If your team has not placed Steri-Strips: Wash the area daily with soap and water. Then coat the incision site with plain Vaseline and cover with a bandage. Do this daily for 5 days after the sutures are removed. After that, no additional wound care is generally needed.  However, if you would like to help fade the scar, you can apply a silicone scar cream, gel or sheet every night. The scar will remodel for one year after the procedure. If a skin cancer was removed, be sure to keep your appointment with your dermatologist for follow-up and let your dermatology team know if you have any new or changing spots between visits.    Please call our office at [phone] for any questions or concerns.Due to recent changes in healthcare laws, you may see results of your pathology and/or laboratory studies on MyChart before the doctors have had a chance to review them. We understand that in some cases there may be results that are confusing or concerning to you. Please understand that not all results are received at the same time and often the doctors may need to interpret multiple results in order to provide you with the best plan of care or course of treatment. Therefore, we ask that you please give us 2 business days to thoroughly review all your results before contacting the office for clarification. Should we see a critical lab result, you will be contacted sooner.   If You Need Anything After Your Visit  If you have any questions or concerns for your doctor, please call our main line at [phone] and press option 4 to reach your doctor's medical assistant. If no one answers, please leave a  voicemail as directed and we will return your call as soon as possible. Messages left after 4 pm will be answered the following business day.   You may also send us a message via MyChart. We typically respond to MyChart messages within 1-2 business days.  For prescription refills, please ask your pharmacy to contact our office. Our fax number is [phone].  If you have an urgent issue when the clinic is closed that cannot wait until the next business day, you can page your doctor at the number below.    Please note that while we do our best to be available for urgent issues outside of office hours, we are not available 24/7.   If you have an urgent issue and are unable to reach us, you may choose to seek medical care at your doctor's office, retail clinic, urgent care center, or emergency room.  If you have a medical emergency, please immediately call 911 or go to the emergency department.  Pager Numbers  - Dr. Kowalski: [phone]  - Dr. Moye: [phone]  - Dr. Stewart: [phone]  In the event of inclement weather, please call our main line at [phone] for an update on the status of any delays or closures.  Dermatology Medication Tips: Please keep the boxes that topical medications come in in order to help keep track of the instructions about where and how to use these. Pharmacies typically print the medication instructions only on the   boxes and not directly on the medication tubes.   If your medication is too expensive, please contact our office at [phone] option 4 or send us a message through MyChart.   We are unable to tell what your co-pay for medications will be in advance as this is different depending on your insurance coverage. However, we may be able to find a substitute medication at lower cost or fill out paperwork to get insurance to cover a needed medication.   If a prior authorization is required to get your medication covered by your insurance  company, please allow us 1-2 business days to complete this process.  Drug prices often vary depending on where the prescription is filled and some pharmacies may offer cheaper prices.  The website www.goodrx.com contains coupons for medications through different pharmacies. The prices here do not account for what the cost may be with help from insurance (it may be cheaper with your insurance), but the website can give you the price if you did not use any insurance.  - You can print the associated coupon and take it with your prescription to the pharmacy.  - You may also stop by our office during regular business hours and pick up a GoodRx coupon card.  - If you need your prescription sent electronically to a different pharmacy, notify our office through [facility] MyChart or by phone at [phone] option 4.     Si Usted Necesita Algo Despus de Su Visita  Tambin puede enviarnos un mensaje a travs de MyChart. Por lo general respondemos a los mensajes de MyChart en el transcurso de 1 a 2 das hbiles.  Para renovar recetas, por favor pida a su farmacia que se ponga en contacto con nuestra oficina. Nuestro nmero de fax es el [phone].  Si tiene un asunto urgente cuando la clnica est cerrada y que no puede esperar hasta el siguiente da hbil, puede llamar/localizar a su doctor(a) al nmero que aparece a continuacin.   Por favor, tenga en cuenta que aunque hacemos todo lo posible para estar disponibles para asuntos urgentes fuera del horario de oficina, no estamos disponibles las 24 horas del da, los 7 das de la semana.   Si tiene un problema urgente y no puede comunicarse con nosotros, puede optar por buscar atencin mdica  en el consultorio de su doctor(a), en una clnica privada, en un centro de atencin urgente o en una sala de emergencias.  Si tiene una emergencia mdica, por favor llame inmediatamente al 911 o vaya a la sala de emergencias.  Nmeros de bper  - Dr.  Kowalski: [phone]  - Dra. Moye: [phone]  - Dra. Stewart: [phone]  En caso de inclemencias del tiempo, por favor llame a nuestra lnea principal al [phone] para una actualizacin sobre el estado de cualquier retraso o cierre.  Consejos para la medicacin en dermatologa: Por favor, guarde las cajas en las que vienen los medicamentos de uso tpico para ayudarle a seguir las instrucciones sobre dnde y cmo usarlos. Las farmacias generalmente imprimen las instrucciones del medicamento slo en las cajas y no directamente en los tubos del medicamento.   Si su medicamento es muy caro, por favor, pngase en contacto con nuestra oficina llamando al [phone] y presione la opcin 4 o envenos un mensaje a travs de MyChart.   No podemos decirle cul ser su copago por los medicamentos por adelantado ya que esto es diferente dependiendo de la cobertura de su seguro. Sin embargo, es posible que podamos   encontrar un medicamento sustituto a menor costo o llenar un formulario para que el seguro cubra el medicamento que se considera necesario.   Si se requiere una autorizacin previa para que su compaa de seguros cubra su medicamento, por favor permtanos de 1 a 2 das hbiles para completar este proceso.  Los precios de los medicamentos varan con frecuencia dependiendo del lugar de dnde se surte la receta y alguna farmacias pueden ofrecer precios ms baratos.  El sitio web www.goodrx.com tiene cupones para medicamentos de diferentes farmacias. Los precios aqu no tienen en cuenta lo que podra costar con la ayuda del seguro (puede ser ms barato con su seguro), pero el sitio web puede darle el precio si no utiliz ningn seguro.  - Puede imprimir el cupn correspondiente y llevarlo con su receta a la farmacia.  - Tambin puede pasar por nuestra oficina durante el horario de atencin regular y recoger una tarjeta de cupones de GoodRx.  - Si necesita que su receta se enve  electrnicamente a una farmacia diferente, informe a nuestra oficina a travs de MyChart de [facility] o por telfono llamando al [phone] y presione la opcin 4.  

## 2023-02-26 ENCOUNTER — Ambulatory Visit
Admission: RE | Admit: 2023-02-26 | Discharge: 2023-02-26 | Disposition: A | Discharge status: Home / Self Care | Payer: PPO | Source: Ambulatory Visit | Attending: Internal Medicine | Admitting: Internal Medicine

## 2023-02-26 DIAGNOSIS — Z1231 Encounter for screening mammogram for malignant neoplasm of breast: Secondary | ICD-10-CM

## 2023-03-05 ENCOUNTER — Ambulatory Visit (INDEPENDENT_AMBULATORY_CARE_PROVIDER_SITE_OTHER): Payer: PPO | Admitting: Licensed Clinical Social Worker

## 2023-03-05 DIAGNOSIS — F4322 Adjustment disorder with anxiety: Secondary | ICD-10-CM | POA: Diagnosis not present

## 2023-03-05 NOTE — Progress Notes (Signed)
[facility] Counselor/Therapist Progress Note  Patient ID: Connie Glass, MRN: [id number]    Date: 03/05/23  Time Spent: 0200  pm - 0300 pm : 60 Minutes  Treatment Type: Individual Therapy.  Reported Symptoms: Anxiety, attempting to adjust to life stage changes  Mental Status Exam: Appearance:  Well Groomed     Behavior: Appropriate  Motor: Normal  Speech/Language:  Clear and Coherent  Affect: Appropriate  Mood: normal  Thought process: normal  Thought content:   WNL  Sensory/Perceptual disturbances:   WNL  Orientation: oriented to person, place, time/date, situation, day of week, month of year, and year  Attention: Good  Concentration: Good  Memory: WNL  Fund of knowledge:  Good  Insight:   Good  Judgment:  Good  Impulse Control: Good   Risk Assessment: Danger to Self:  No Self-injurious Behavior: No Danger to Others: No Duty to Warn:no Physical Aggression / Violence:No  Access to Firearms a concern: No  Gang Involvement:No   Subjective:   Connie Glass participated from office in person at [facility] with Clinician present. Connie Glass  consented to treatment.   Interventions: Cognitive Behavioral Therapy  Connie Glass presented on time for her scheduled session. Connie Glass identified that she has been able to identify certain aspects of her personality that go back to her being the oldest child and being the one who was expected to be grown and mature more quickly. Connie Glass identified that she is independent to the point that it can be intimidating. Patient identified how this can negatively impact the building of new relationships. Patient was insightful and engaged in discussion.  Diagnosis:Adjustment Disorder with anxious mood   Plan: Connie Glass  is to use CBT, mindfulness and coping skills to help manage decrease symptoms associated with their diagnosis. Patient will seek to be more active in the community and socially to increase the opportunity to  build healthy social relationships.   Long-term goal:   Connie Glass will reduce overall level, frequency, and intensity of the feelings of depression, anxiety and loneliness evidenced by decreased negative self talk, and helpless feelings, increasing use of meditation and breathing exercises from 6 to 7 days/week to 0 to 2 days/week per client report for at least 3 consecutive months. Treatment plan will be reviewed by 02/12/2024.  Short-term goal:  Connie Glass will verbally express understanding of the relationship between feelings of depression, anxiety and their impact on thinking patterns and behaviors. Verbalize an understanding of the role that distorted thinking plays in creating fears, excessive worry, and ruminations.  Connie Glass MSW, LCSW DATE: 03/05/2023

## 2023-03-12 ENCOUNTER — Ambulatory Visit (INDEPENDENT_AMBULATORY_CARE_PROVIDER_SITE_OTHER): Payer: PPO | Admitting: Nurse Practitioner

## 2023-03-12 ENCOUNTER — Encounter (INDEPENDENT_AMBULATORY_CARE_PROVIDER_SITE_OTHER): Payer: Self-pay | Admitting: Nurse Practitioner

## 2023-03-12 VITALS — BP 154/80 | HR 80 | Resp 16 | Wt 204.0 lb

## 2023-03-12 DIAGNOSIS — I8312 Varicose veins of left lower extremity with inflammation: Secondary | ICD-10-CM | POA: Diagnosis not present

## 2023-03-12 DIAGNOSIS — I8311 Varicose veins of right lower extremity with inflammation: Secondary | ICD-10-CM

## 2023-03-12 NOTE — Progress Notes (Signed)
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   Procedure: Sclerotherapy using hypertonic saline mixed with 1% Lidocaine was performed on the bilateral lower extremity. Compression wraps were placed. The patient tolerated the procedure well. 

## 2023-03-19 ENCOUNTER — Ambulatory Visit (INDEPENDENT_AMBULATORY_CARE_PROVIDER_SITE_OTHER): Payer: PPO | Admitting: Licensed Clinical Social Worker

## 2023-03-19 DIAGNOSIS — F4322 Adjustment disorder with anxiety: Secondary | ICD-10-CM | POA: Diagnosis not present

## 2023-03-19 NOTE — Progress Notes (Signed)
[facility] Counselor/Therapist Progress Note  Patient ID: Connie Glass, MRN: [id number]    Date: 03/19/23  Time Spent: 0210  pm - 0305 pm : 55 Minutes  Treatment Type: Individual Therapy.  Reported Symptoms: Symptoms of anxiety and loneliness related to desire to begin dating again and socializing in the community.  Mental Status Exam: Appearance:  Well Groomed     Behavior: Appropriate  Motor: Normal  Speech/Language:  Clear and Coherent  Affect: Appropriate  Mood: normal  Thought process: normal  Thought content:   WNL  Sensory/Perceptual disturbances:   WNL  Orientation: oriented to person, place, time/date, situation, day of week, month of year, and year  Attention: Good  Concentration: Good  Memory: WNL  Fund of knowledge:  Good  Insight:   Good  Judgment:  Good  Impulse Control: Good   Risk Assessment: Danger to Self:  No Self-injurious Behavior: No Danger to Others: No Duty to Warn:no Physical Aggression / Violence:No  Access to Firearms a concern: No  Gang Involvement:No   Subjective:   Connie Glass participated from office located at [facility] with Clinician present. Connie Glass and consented to treatment.    Connie Glass presented on time for her scheduled session. 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 Patient identified how it affected her life both financially and socially and the negative impact on her emotionally. Patient identified that she is anxious about being social as so much has changed and being alone had became comfortable. Patient was insightful as she understands the importance that social interaction plays in our lives both emotionally and physically.   Interventions: Cognitive Behavioral Therapy  Diagnosis: Symptoms of anxiety related to life changes and challenges.   Plan: Connie Glass is to use CBT, mindfulness and coping skills to help  manage decrease symptoms associated with their diagnosis.   Long-term goal:   Connie Glass will reduce overall level, frequency, and intensity of the feelings of anxiety and loneliness evidenced by  decreased  negative self talk, and helpless feelings from 6 to 7 days/week to 0 to 2 days/week per client report for at least 3 consecutive months. Treatment plan to be reviewed by 02/12/2024.  Short-term goal:  Connie Glass will verbally express understanding of the relationship between feelings of loneliness and  anxiety and their impact on thinking patterns and behaviors. Verbalize an understanding of the role that distorted thinking plays in creating fears, excessive worry, and ruminations.  Connie Glass MSW, LCSW DATE:03/19/2023

## 2023-04-10 ENCOUNTER — Encounter (INDEPENDENT_AMBULATORY_CARE_PROVIDER_SITE_OTHER): Payer: Self-pay | Admitting: Nurse Practitioner

## 2023-04-10 ENCOUNTER — Ambulatory Visit (INDEPENDENT_AMBULATORY_CARE_PROVIDER_SITE_OTHER): Payer: PPO | Admitting: Nurse Practitioner

## 2023-04-10 VITALS — BP 123/71 | HR 86 | Resp 16 | Wt 204.0 lb

## 2023-04-10 DIAGNOSIS — I83813 Varicose veins of bilateral lower extremities with pain: Secondary | ICD-10-CM | POA: Diagnosis not present

## 2023-04-10 NOTE — Progress Notes (Signed)
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   Procedure: Sclerotherapy using hypertonic saline mixed with 1% Lidocaine was performed on the bilateral lower extremity. Compression wraps were placed. The patient tolerated the procedure well.  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  The patient had some sclerotherapy done on a very large varicosity in the left lower extremity but it was not responsive, based on this I believe foam schlerotherapy will be better suited.  Patient should undergo injection sclerotherapy to treat the residual varicosities.  The risks, benefits and alternative therapies were reviewed in detail with the patient.  All questions were answered.  The patient agrees to proceed with foamsclerotherapy at their convenience.  The patient will continue wearing the graduated compression stockings and using the over-the-counter pain medications to treat her symptoms.

## 2023-04-22 ENCOUNTER — Ambulatory Visit (INDEPENDENT_AMBULATORY_CARE_PROVIDER_SITE_OTHER): Payer: PPO | Admitting: Licensed Clinical Social Worker

## 2023-04-22 DIAGNOSIS — F4322 Adjustment disorder with anxiety: Secondary | ICD-10-CM | POA: Diagnosis not present
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
## 2023-05-08 ENCOUNTER — Ambulatory Visit: Payer: PPO | Admitting: Licensed Clinical Social Worker

## 2023-05-08 ENCOUNTER — Ambulatory Visit (INDEPENDENT_AMBULATORY_CARE_PROVIDER_SITE_OTHER): Payer: PPO | Admitting: Nurse Practitioner

## 2023-05-08 ENCOUNTER — Encounter (INDEPENDENT_AMBULATORY_CARE_PROVIDER_SITE_OTHER): Payer: Self-pay | Admitting: Nurse Practitioner

## 2023-05-08 VITALS — BP 145/85 | HR 88 | Resp 18 | Wt 204.0 lb

## 2023-05-08 DIAGNOSIS — I8311 Varicose veins of right lower extremity with inflammation: Secondary | ICD-10-CM

## 2023-05-08 DIAGNOSIS — I8312 Varicose veins of left lower extremity with inflammation: Secondary | ICD-10-CM

## 2023-05-08 NOTE — Progress Notes (Signed)
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   Procedure: Sclerotherapy using hypertonic saline mixed with 1% Lidocaine was performed on the bilateral lower extremity. Compression wraps were placed. The patient tolerated the procedure well.  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  The patient had some sclerotherapy done on a very large varicosity in the left lower extremity but it was not responsive, based on this I believe foam schlerotherapy will be better suited.  Patient should undergo injection sclerotherapy to treat the residual varicosities.  The risks, benefits and alternative therapies were reviewed in detail with the patient.  All questions were answered.  The patient agrees to proceed with foamsclerotherapy at their convenience.  The patient will continue wearing the graduated compression stockings and using the over-the-counter pain medications to treat her symptoms.

## 2023-05-14 ENCOUNTER — Ambulatory Visit (INDEPENDENT_AMBULATORY_CARE_PROVIDER_SITE_OTHER): Payer: PPO | Admitting: Licensed Clinical Social Worker

## 2023-05-14 DIAGNOSIS — F4322 Adjustment disorder with anxiety: Secondary | ICD-10-CM | POA: Insufficient documentation

## 2023-05-14 NOTE — Progress Notes (Signed)
[facility] Counselor/Therapist Progress Note  Patient ID: Connie Glass, MRN: [id number]    Date: 05/14/23  Time Spent: 100  pm - 200 pm : 60 Minutes  Treatment Type: Individual Therapy.  Reported Symptoms: Symptoms of anxiety and depression  Mental Status Exam: Appearance:  Casual     Behavior: Appropriate  Motor: Normal  Speech/Language:  Clear and Coherent  Affect: Appropriate  Mood: depressed  Thought process: normal  Thought content:   WNL  Sensory/Perceptual disturbances:   WNL  Orientation: oriented to person, place, time/date, situation, day of week, month of year, and year  Attention: Good  Concentration: Good  Memory: WNL  Fund of knowledge:  Good  Insight:   Good  Judgment:  Good  Impulse Control: Good   Risk Assessment: Danger to Self:  No Self-injurious Behavior: No Danger to Others: No Duty to Warn:no Physical Aggression / Violence:No  Access to Firearms a concern: No  Gang Involvement:No   Subjective:   Ladean Raya participated from home, via video, and consented to treatment. Therapist participated from office. We met online due to patient request.  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 Clinician praised patients talent and encouraged patient to find a place to stage and sell her work. Clinician and patient engaged in discussion identifying how creativity can be good for our mental well being and can be a way for [place] to express ourselves as well as a way to calm our anxiety and stress.   Interventions: Cognitive Behavioral Therapy  Diagnosis: Adjustment Disorder with anxious mood.   Plan: Shiza is to use CBT, mindfulness and coping skills to help manage decrease symptoms associated with their diagnosis.   Long-term goal:   Rhylin will reduce overall level, frequency, and intensity of the feelings of depression, anxiety  evidenced by       decreased  irritability, negative self talk, and helpless feelings from 6 to 7 days/week to 0 to 2 days/week per client report for at least 3 consecutive months. Treatment plan to be reviewed by 02/12/2024.  Short-term goal:  Kaylean verbally express understanding of the relationship between feelings of depression, anxiety and their impact on thinking patterns and behaviors. Verbalize an understanding of the role that distorted thinking plays in creating fears, excessive worry, and ruminations.  Phyllis Ginger MSW, LCSW Date: 05/14/2023

## 2023-05-16 DIAGNOSIS — R7303 Prediabetes: Secondary | ICD-10-CM | POA: Diagnosis not present

## 2023-05-16 DIAGNOSIS — E785 Hyperlipidemia, unspecified: Secondary | ICD-10-CM | POA: Diagnosis not present

## 2023-05-16 DIAGNOSIS — R3121 Asymptomatic microscopic hematuria: Secondary | ICD-10-CM | POA: Diagnosis not present

## 2023-05-16 DIAGNOSIS — E034 Atrophy of thyroid (acquired): Secondary | ICD-10-CM | POA: Diagnosis not present

## 2023-05-23 DIAGNOSIS — E785 Hyperlipidemia, unspecified: Secondary | ICD-10-CM | POA: Diagnosis not present

## 2023-05-23 DIAGNOSIS — R3121 Asymptomatic microscopic hematuria: Secondary | ICD-10-CM | POA: Diagnosis not present

## 2023-05-23 DIAGNOSIS — Z0000 Encounter for general adult medical examination without abnormal findings: Secondary | ICD-10-CM | POA: Diagnosis not present

## 2023-05-23 DIAGNOSIS — E034 Atrophy of thyroid (acquired): Secondary | ICD-10-CM | POA: Diagnosis not present

## 2023-05-23 DIAGNOSIS — Z23 Encounter for immunization: Secondary | ICD-10-CM | POA: Diagnosis not present

## 2023-05-23 DIAGNOSIS — M519 Unspecified thoracic, thoracolumbar and lumbosacral intervertebral disc disorder: Secondary | ICD-10-CM | POA: Diagnosis not present

## 2023-05-23 DIAGNOSIS — R911 Solitary pulmonary nodule: Secondary | ICD-10-CM | POA: Diagnosis not present

## 2023-05-23 DIAGNOSIS — R7303 Prediabetes: Secondary | ICD-10-CM | POA: Diagnosis not present

## 2023-05-23 DIAGNOSIS — I2510 Atherosclerotic heart disease of native coronary artery without angina pectoris: Secondary | ICD-10-CM | POA: Diagnosis not present

## 2023-06-03 ENCOUNTER — Ambulatory Visit (INDEPENDENT_AMBULATORY_CARE_PROVIDER_SITE_OTHER): Payer: PPO | Admitting: Licensed Clinical Social Worker

## 2023-06-03 DIAGNOSIS — F4322 Adjustment disorder with anxiety: Secondary | ICD-10-CM | POA: Diagnosis not present
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
## 2023-06-17 ENCOUNTER — Ambulatory Visit: Payer: PPO | Admitting: Licensed Clinical Social Worker

## 2023-07-01 ENCOUNTER — Ambulatory Visit: Payer: PPO | Admitting: Licensed Clinical Social Worker

## 2023-07-01 DIAGNOSIS — F4322 Adjustment disorder with anxiety: Secondary | ICD-10-CM

## 2023-07-01 NOTE — Progress Notes (Signed)
[facility] Counselor/Therapist Progress Note  Patient ID: JAMIEANN WASSENAAR, MRN: [id number]    Date: 07/01/23  Time Spent: 0102  pm - 0155 pm : 53 Minutes  Treatment Type: Individual Therapy.  Reported Symptoms: Anxiety related to life changes and mothers death.  Mental Status Exam: Appearance:  Casual     Behavior: Appropriate  Motor: Normal  Speech/Language:  Clear and Coherent  Affect: Appropriate  Mood: normal  Thought process: normal  Thought content:   WNL  Sensory/Perceptual disturbances:   WNL  Orientation: oriented to person, place, time/date, situation, day of week, month of year, and year  Attention: Good  Concentration: Good  Memory: WNL  Fund of knowledge:  Good  Insight:   Good  Judgment:  Good  Impulse Control: Good   Risk Assessment: Danger to Self:  No Self-injurious Behavior: No Danger to Others: No Duty to Warn:no Physical Aggression / Violence:No  Access to Firearms a concern: No  Gang Involvement:No   Subjective:   Ladean Raya participated from home, via video, and consented to treatment. Therapist participated from office. We met online due to patient preference.   Therapy Note: Client presented with significant anxiety primarily focused on excessive worry regarding the health and well-being of her sister, reporting frequent intrusive thoughts about potential negative scenarios and difficulty managing these concerns despite reassurance. Client described physical symptoms like increased difficulty sleeping,  and excessive worry.  Client detailed specific concerns about their sisters health, particularly related to recent medical changes, and expressed anxieties about their sisters overall mental well being.  Client reported attempting to manage anxiety by checking in with her sister daily which often exacerbated their worries further.  Exploration revealed a pattern of "catastrophizing" where minor issues were magnified into  worst-case scenarios, contributing to heightened anxiety.  Therapeutic interventions: Cognitive restructuring: Identified cognitive distortions and challenged negative thoughts by discussing realistic probabilities and alternative interpretations. Relaxation techniques: Practiced deep breathing exercises and progressive muscle relaxation to manage physical symptoms of anxiety. Mindfulness practices: Introduced mindfulness meditation to help client stay present and observe anxious thoughts without judgment. Communication strategies: Discussed healthy communication approaches to express concerns to loved ones without being overly demanding. Treatment plan: Continue cognitive behavioral therapy (CBT) with focus on cognitive restructuring and exposure techniques to gradually reduce anxiety. Practice relaxation techniques regularly at home. Establish a support system with friends or a support group for additional emotional support. Discuss the possibility of family therapy to address communication dynamics with loved ones. Client response: Client engaged actively in discussion, appeared receptive to therapeutic interventions, and expressed a desire to learn coping skills to manage their anxiety effectively. Next steps: Further explore the root causes of client's anxiety, including potential past experiences that may contribute to their worry patterns, and continue to build upon coping skills learned in session. Treatment plan to be reviewed by 02/12/2024  Interventions: Cognitive Behavioral Therapy  Diagnosis: Adjustment Disorder with anxious mood   Phyllis Ginger MSW, LCSW DATE: 07/01/2023

## 2023-07-09 ENCOUNTER — Ambulatory Visit: Payer: PPO | Admitting: Dermatology

## 2023-07-22 ENCOUNTER — Ambulatory Visit: Payer: PPO | Admitting: Licensed Clinical Social Worker

## 2023-10-28 ENCOUNTER — Encounter: Payer: Self-pay | Admitting: Dermatology

## 2023-10-28 ENCOUNTER — Ambulatory Visit (INDEPENDENT_AMBULATORY_CARE_PROVIDER_SITE_OTHER): Payer: PPO | Admitting: Dermatology

## 2023-10-28 DIAGNOSIS — I8393 Asymptomatic varicose veins of bilateral lower extremities: Secondary | ICD-10-CM

## 2023-10-28 DIAGNOSIS — I781 Nevus, non-neoplastic: Secondary | ICD-10-CM

## 2023-10-28 DIAGNOSIS — W908XXA Exposure to other nonionizing radiation, initial encounter: Secondary | ICD-10-CM | POA: Diagnosis not present

## 2023-10-28 DIAGNOSIS — D1801 Hemangioma of skin and subcutaneous tissue: Secondary | ICD-10-CM

## 2023-10-28 DIAGNOSIS — L820 Inflamed seborrheic keratosis: Secondary | ICD-10-CM | POA: Diagnosis not present

## 2023-10-28 DIAGNOSIS — L814 Other melanin hyperpigmentation: Secondary | ICD-10-CM

## 2023-10-28 DIAGNOSIS — D229 Melanocytic nevi, unspecified: Secondary | ICD-10-CM

## 2023-10-28 DIAGNOSIS — Z1283 Encounter for screening for malignant neoplasm of skin: Secondary | ICD-10-CM

## 2023-10-28 DIAGNOSIS — Z872 Personal history of diseases of the skin and subcutaneous tissue: Secondary | ICD-10-CM

## 2023-10-28 DIAGNOSIS — Q828 Other specified congenital malformations of skin: Secondary | ICD-10-CM

## 2023-10-28 DIAGNOSIS — L578 Other skin changes due to chronic exposure to nonionizing radiation: Secondary | ICD-10-CM

## 2023-10-28 DIAGNOSIS — Z85828 Personal history of other malignant neoplasm of skin: Secondary | ICD-10-CM

## 2023-10-28 DIAGNOSIS — L821 Other seborrheic keratosis: Secondary | ICD-10-CM

## 2023-10-28 NOTE — Patient Instructions (Addendum)

## 2023-10-28 NOTE — Progress Notes (Signed)
 Follow-Up Visit   Subjective  Connie Glass is a 73 y.o. female who presents for the following: Skin Cancer Screening and Full Body Skin Exam, hx of BCC, Aks, check spot L cheek , 65yrs, check spots L neck, pt picks at, hx of Porokeratosis R pretibia, tried SM Cholesterol mix for many months and did not help, then pt tried Frankincense for one month and it cleared area  The patient presents for Total-Body Skin Exam (TBSE) for skin cancer screening and mole check. The patient has spots, moles and lesions to be evaluated, some may be new or changing and the patient may have concern these could be cancer.    The following portions of the chart were reviewed this encounter and updated as appropriate: medications, allergies, medical history  Review of Systems:  No other skin or systemic complaints except as noted in HPI or Assessment and Plan.  Objective  Well appearing patient in no apparent distress; mood and affect are within normal limits.  A full examination was performed including scalp, head, eyes, ears, nose, lips, neck, chest, axillae, abdomen, back, buttocks, bilateral upper extremities, bilateral lower extremities, hands, feet, fingers, toes, fingernails, and toenails. All findings within normal limits unless otherwise noted below.   Relevant physical exam findings are noted in the Assessment and Plan.  L clavicle x 6 (6) Stuck on waxy paps with erythema  L medial cheek   R pretibial   Assessment & Plan   SKIN CANCER SCREENING PERFORMED TODAY.  ACTINIC DAMAGE - Chronic condition, secondary to cumulative UV/sun exposure - diffuse scaly erythematous macules with underlying dyspigmentation - Recommend daily broad spectrum sunscreen SPF 30+ to sun-exposed areas, reapply every 2 hours as needed.  - Staying in the shade or wearing long sleeves, sun glasses (UVA+UVB protection) and wide brim hats (4-inch brim around the entire circumference of the hat) are also recommended  for sun protection.  - Call for new or changing lesions.  LENTIGINES, SEBORRHEIC KERATOSES, HEMANGIOMAS - Benign normal skin lesions - Benign-appearing - Call for any changes  MELANOCYTIC NEVI - Tan-brown and/or pink-flesh-colored symmetric macules and papules - Benign appearing on exam today - Observation - Call clinic for new or changing moles - Recommend daily use of broad spectrum spf 30+ sunscreen to sun-exposed areas.   HISTORY OF BASAL CELL CARCINOMA OF THE SKIN - No evidence of recurrence today- R paranasal - Recommend regular full body skin exams - Recommend daily broad spectrum sunscreen SPF 30+ to sun-exposed areas, reapply every 2 hours as needed.  - Call if any new or changing lesions are noted between office visits    HISTORY OF PRECANCEROUS ACTINIC KERATOSIS - site(s) of PreCancerous Actinic Keratosis clear today. - these may recur and new lesions may form requiring treatment to prevent transformation into skin cancer - observe for new or changing spots and contact [facility] for appointment if occur - photoprotection with sun protective clothing; sunglasses and broad spectrum sunscreen with SPF of at least 30 + and frequent self skin exams recommended - yearly exams by a dermatologist recommended for persons with history of PreCancerous Actinic Keratoses   LENTIGO vs SK Exam: 4.0 x 3.73mm tan slightly waxy macule L medial cheek, photo taken today, Benign features under dermoscopy.   Treatment Plan: Benign-appearing.  Observation.  Call clinic for new or changing moles.  Recommend daily use of broad spectrum spf 30+ sunscreen to sun-exposed areas.   POROKERATOSIS R pretibia Improved with topical frankincense (peeled off per  pt) No improvement with previous SM Cholesterol cream Exam: pink tan smooth patch R pretibia, photo taken today  Treatment Plan: Improving, d/c frankincense, apply daily sunscreen, observe for changes/recurrence   Varicose  Veins/Spider Veins - Dilated blue, purple or red veins at the lower extremities - Reassured - Smaller vessels can be treated by sclerotherapy (a procedure to inject a medicine into the veins to make them disappear) if desired, but the treatment is not covered by insurance. Larger vessels may be covered if symptomatic and we would refer to vascular surgeon if treatment desired. INFLAMED SEBORRHEIC KERATOSIS (6) L clavicle x 6 (6) Symptomatic, irritating, patient would like treated. Destruction of lesion - L clavicle x 6 (6)  Destruction method: cryotherapy   Informed consent: discussed and consent obtained   Lesion destroyed using liquid nitrogen: Yes   Region frozen until ice ball extended beyond lesion: Yes   Outcome: patient tolerated procedure well with no complications   Post-procedure details: wound care instructions given   Additional details:  Prior to procedure, discussed risks of blister formation, small wound, skin dyspigmentation, or rare scar following cryotherapy. Recommend Vaseline ointment to treated areas while healing.  Return in about 1 year (around 10/27/2024) for TBSE, Hx of BCC, Hx of AKs.  I, Ardis Rowan, RMA, am acting as scribe for Willeen Niece, MD .   Documentation: I have reviewed the above documentation for accuracy and completeness, and I agree with the above.  Willeen Niece, MD

## 2023-11-25 ENCOUNTER — Other Ambulatory Visit: Payer: Self-pay | Admitting: Endocrinology

## 2023-11-25 DIAGNOSIS — E049 Nontoxic goiter, unspecified: Secondary | ICD-10-CM

## 2023-11-27 ENCOUNTER — Other Ambulatory Visit: Payer: Self-pay | Admitting: Internal Medicine

## 2023-11-27 DIAGNOSIS — R7303 Prediabetes: Secondary | ICD-10-CM

## 2023-11-27 DIAGNOSIS — R911 Solitary pulmonary nodule: Secondary | ICD-10-CM

## 2023-12-09 ENCOUNTER — Ambulatory Visit
Admission: RE | Admit: 2023-12-09 | Discharge: 2023-12-09 | Disposition: A | Discharge status: Home / Self Care | Source: Ambulatory Visit | Attending: Internal Medicine | Admitting: Internal Medicine

## 2023-12-09 DIAGNOSIS — R7303 Prediabetes: Secondary | ICD-10-CM | POA: Insufficient documentation

## 2023-12-09 DIAGNOSIS — R911 Solitary pulmonary nodule: Secondary | ICD-10-CM | POA: Insufficient documentation

## 2023-12-24 ENCOUNTER — Encounter (INDEPENDENT_AMBULATORY_CARE_PROVIDER_SITE_OTHER): Payer: Self-pay

## 2024-01-14 ENCOUNTER — Other Ambulatory Visit: Payer: Self-pay | Admitting: Internal Medicine

## 2024-01-14 DIAGNOSIS — Z1231 Encounter for screening mammogram for malignant neoplasm of breast: Secondary | ICD-10-CM

## 2024-01-20 DIAGNOSIS — E890 Postprocedural hypothyroidism: Secondary | ICD-10-CM | POA: Diagnosis not present

## 2024-02-04 ENCOUNTER — Telehealth (INDEPENDENT_AMBULATORY_CARE_PROVIDER_SITE_OTHER): Payer: Self-pay | Admitting: Vascular Surgery

## 2024-02-04 NOTE — Telephone Encounter (Signed)
 LVM for pt TCB and schedule appt  bilateral FOAM sclero. see gs. auth # [id number] exp: 6.24.25 - 9.22.25

## 2024-02-27 ENCOUNTER — Ambulatory Visit

## 2024-03-03 ENCOUNTER — Ambulatory Visit
Admission: RE | Admit: 2024-03-03 | Discharge: 2024-03-03 | Disposition: A | Discharge status: Home / Self Care | Source: Ambulatory Visit | Attending: Internal Medicine | Admitting: Internal Medicine

## 2024-03-03 DIAGNOSIS — Z1231 Encounter for screening mammogram for malignant neoplasm of breast: Secondary | ICD-10-CM | POA: Diagnosis not present

## 2024-03-15 NOTE — Progress Notes (Signed)
   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  Procedure:  Sclerotherapy using SDS foam mixed with 1% Lidocaine was performed on the left lower extremity.  Compression wraps were placed.  The patient tolerated the procedure well.  Plan:  Follow up as needed.

## 2024-03-16 ENCOUNTER — Ambulatory Visit (INDEPENDENT_AMBULATORY_CARE_PROVIDER_SITE_OTHER): Admitting: Vascular Surgery

## 2024-03-16 ENCOUNTER — Encounter (INDEPENDENT_AMBULATORY_CARE_PROVIDER_SITE_OTHER): Payer: Self-pay | Admitting: Vascular Surgery

## 2024-03-16 VITALS — BP 144/85 | HR 82 | Resp 18 | Ht 63.0 in | Wt 220.0 lb

## 2024-03-16 DIAGNOSIS — I8312 Varicose veins of left lower extremity with inflammation: Secondary | ICD-10-CM

## 2024-03-16 DIAGNOSIS — I8311 Varicose veins of right lower extremity with inflammation: Secondary | ICD-10-CM

## 2024-03-17 ENCOUNTER — Encounter (INDEPENDENT_AMBULATORY_CARE_PROVIDER_SITE_OTHER): Payer: Self-pay | Admitting: Vascular Surgery

## 2024-05-21 DIAGNOSIS — R3121 Asymptomatic microscopic hematuria: Secondary | ICD-10-CM | POA: Diagnosis not present

## 2024-05-21 DIAGNOSIS — R7303 Prediabetes: Secondary | ICD-10-CM | POA: Diagnosis not present

## 2024-05-21 DIAGNOSIS — E034 Atrophy of thyroid (acquired): Secondary | ICD-10-CM | POA: Diagnosis not present

## 2024-05-21 DIAGNOSIS — E785 Hyperlipidemia, unspecified: Secondary | ICD-10-CM | POA: Diagnosis not present

## 2024-05-28 DIAGNOSIS — Z2821 Immunization not carried out because of patient refusal: Secondary | ICD-10-CM | POA: Diagnosis not present

## 2024-05-28 DIAGNOSIS — E785 Hyperlipidemia, unspecified: Secondary | ICD-10-CM | POA: Diagnosis not present

## 2024-05-28 DIAGNOSIS — E034 Atrophy of thyroid (acquired): Secondary | ICD-10-CM | POA: Diagnosis not present

## 2024-05-28 DIAGNOSIS — R3121 Asymptomatic microscopic hematuria: Secondary | ICD-10-CM | POA: Diagnosis not present

## 2024-05-28 DIAGNOSIS — I2510 Atherosclerotic heart disease of native coronary artery without angina pectoris: Secondary | ICD-10-CM | POA: Diagnosis not present

## 2024-05-28 DIAGNOSIS — M791 Myalgia, unspecified site: Secondary | ICD-10-CM | POA: Diagnosis not present

## 2024-05-28 DIAGNOSIS — Z0000 Encounter for general adult medical examination without abnormal findings: Secondary | ICD-10-CM | POA: Diagnosis not present

## 2024-05-28 DIAGNOSIS — Z23 Encounter for immunization: Secondary | ICD-10-CM | POA: Diagnosis not present

## 2024-05-28 DIAGNOSIS — M810 Age-related osteoporosis without current pathological fracture: Secondary | ICD-10-CM | POA: Diagnosis not present

## 2024-05-28 DIAGNOSIS — R7303 Prediabetes: Secondary | ICD-10-CM | POA: Diagnosis not present

## 2024-05-28 DIAGNOSIS — M519 Unspecified thoracic, thoracolumbar and lumbosacral intervertebral disc disorder: Secondary | ICD-10-CM | POA: Diagnosis not present

## 2024-05-28 DIAGNOSIS — Z1331 Encounter for screening for depression: Secondary | ICD-10-CM | POA: Diagnosis not present

## 2024-09-02 ENCOUNTER — Other Ambulatory Visit (HOSPITAL_BASED_OUTPATIENT_CLINIC_OR_DEPARTMENT_OTHER): Payer: Self-pay | Admitting: Endocrinology

## 2024-09-02 DIAGNOSIS — M810 Age-related osteoporosis without current pathological fracture: Secondary | ICD-10-CM

## 2024-11-10 ENCOUNTER — Encounter: Admitting: Dermatology
